# Patient Record
Sex: Female | Born: 1985 | Race: White | Hispanic: No | Marital: Married | Smoking: Former smoker
Health system: Southern US, Community
[De-identification: ages and names within clinical notes are randomized; demographics above are authoritative.]

## PROBLEM LIST (undated history)

## (undated) DIAGNOSIS — F319 Bipolar disorder, unspecified: Secondary | ICD-10-CM

## (undated) DIAGNOSIS — N83201 Unspecified ovarian cyst, right side: Secondary | ICD-10-CM

## (undated) DIAGNOSIS — R51 Headache: Secondary | ICD-10-CM

## (undated) DIAGNOSIS — R519 Headache, unspecified: Secondary | ICD-10-CM

## (undated) DIAGNOSIS — G43909 Migraine, unspecified, not intractable, without status migrainosus: Secondary | ICD-10-CM

## (undated) DIAGNOSIS — J45909 Unspecified asthma, uncomplicated: Secondary | ICD-10-CM

## (undated) DIAGNOSIS — N83202 Unspecified ovarian cyst, left side: Secondary | ICD-10-CM

## (undated) HISTORY — PX: TONSILLECTOMY: SUR1361

## (undated) HISTORY — PX: TUBAL LIGATION: SHX77

## (undated) HISTORY — PX: APPENDECTOMY: SHX54

## (undated) HISTORY — PX: ANKLE ARTHROSCOPY: SUR85

---
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
## 2005-01-03 ENCOUNTER — Observation Stay (HOSPITAL_COMMUNITY): Admission: AD | Admit: 2005-01-03 | Discharge: 2005-01-03 | Payer: Self-pay | Admitting: Gynecology

## 2005-01-04 ENCOUNTER — Inpatient Hospital Stay (HOSPITAL_COMMUNITY): Admission: AD | Admit: 2005-01-04 | Discharge: 2005-01-04 | Payer: Self-pay | Admitting: Gynecology

## 2005-01-08 ENCOUNTER — Inpatient Hospital Stay (HOSPITAL_COMMUNITY): Admission: AD | Admit: 2005-01-08 | Discharge: 2005-01-08 | Payer: Self-pay | Admitting: Gynecology

## 2005-01-14 ENCOUNTER — Inpatient Hospital Stay (HOSPITAL_COMMUNITY): Admission: AD | Admit: 2005-01-14 | Discharge: 2005-01-16 | Payer: Self-pay | Admitting: Gynecology

## 2005-01-30 ENCOUNTER — Ambulatory Visit: Admission: RE | Admit: 2005-01-30 | Discharge: 2005-01-30 | Payer: Self-pay | Admitting: Gynecology

## 2005-11-22 ENCOUNTER — Emergency Department: Payer: Self-pay | Admitting: Emergency Medicine

## 2006-06-18 ENCOUNTER — Emergency Department: Payer: Self-pay | Admitting: Emergency Medicine

## 2006-08-11 ENCOUNTER — Emergency Department: Payer: Self-pay | Admitting: Emergency Medicine

## 2006-09-16 ENCOUNTER — Emergency Department: Payer: Self-pay | Admitting: Emergency Medicine

## 2006-09-17 ENCOUNTER — Emergency Department: Payer: Self-pay | Admitting: Unknown Physician Specialty

## 2006-10-05 ENCOUNTER — Emergency Department (HOSPITAL_COMMUNITY): Admission: EM | Admit: 2006-10-05 | Discharge: 2006-10-05 | Payer: Self-pay | Admitting: Emergency Medicine

## 2006-10-12 ENCOUNTER — Emergency Department: Payer: Self-pay | Admitting: Unknown Physician Specialty

## 2007-01-31 ENCOUNTER — Emergency Department: Payer: Self-pay | Admitting: Emergency Medicine

## 2007-04-22 ENCOUNTER — Ambulatory Visit: Payer: Self-pay | Admitting: Family Medicine

## 2007-05-04 ENCOUNTER — Emergency Department: Payer: Self-pay | Admitting: Internal Medicine

## 2007-05-23 ENCOUNTER — Emergency Department: Payer: Self-pay | Admitting: Internal Medicine

## 2007-06-07 ENCOUNTER — Emergency Department: Payer: Self-pay | Admitting: Emergency Medicine

## 2007-06-11 ENCOUNTER — Emergency Department: Payer: Self-pay | Admitting: Emergency Medicine

## 2007-06-30 ENCOUNTER — Observation Stay: Payer: Self-pay | Admitting: Obstetrics and Gynecology

## 2007-07-06 ENCOUNTER — Ambulatory Visit: Payer: Self-pay | Admitting: General Practice

## 2007-08-17 ENCOUNTER — Observation Stay: Payer: Self-pay | Admitting: Obstetrics and Gynecology

## 2007-08-18 ENCOUNTER — Emergency Department: Payer: Self-pay | Admitting: Emergency Medicine

## 2007-08-19 ENCOUNTER — Observation Stay: Payer: Self-pay | Admitting: Obstetrics and Gynecology

## 2007-09-07 ENCOUNTER — Emergency Department: Payer: Self-pay | Admitting: Emergency Medicine

## 2007-09-30 ENCOUNTER — Observation Stay: Payer: Self-pay | Admitting: Obstetrics and Gynecology

## 2007-10-04 ENCOUNTER — Observation Stay: Payer: Self-pay | Admitting: Certified Nurse Midwife

## 2007-10-16 ENCOUNTER — Observation Stay: Payer: Self-pay

## 2007-10-17 ENCOUNTER — Observation Stay: Payer: Self-pay | Admitting: Obstetrics and Gynecology

## 2007-10-22 ENCOUNTER — Observation Stay: Payer: Self-pay

## 2007-10-24 ENCOUNTER — Observation Stay: Payer: Self-pay | Admitting: Obstetrics and Gynecology

## 2007-10-28 ENCOUNTER — Observation Stay: Payer: Self-pay

## 2007-10-30 ENCOUNTER — Inpatient Hospital Stay: Payer: Self-pay | Admitting: Obstetrics and Gynecology

## 2008-01-24 ENCOUNTER — Emergency Department: Payer: Self-pay | Admitting: Emergency Medicine

## 2008-07-21 ENCOUNTER — Emergency Department: Payer: Self-pay | Admitting: Unknown Physician Specialty

## 2008-09-27 ENCOUNTER — Ambulatory Visit: Payer: Self-pay

## 2008-12-15 ENCOUNTER — Observation Stay: Payer: Self-pay | Admitting: Obstetrics and Gynecology

## 2009-02-14 ENCOUNTER — Observation Stay: Payer: Self-pay | Admitting: Internal Medicine

## 2009-03-03 ENCOUNTER — Observation Stay: Payer: Self-pay

## 2009-03-10 ENCOUNTER — Inpatient Hospital Stay: Payer: Self-pay | Admitting: Obstetrics and Gynecology

## 2009-03-31 ENCOUNTER — Ambulatory Visit: Payer: Self-pay | Admitting: Obstetrics and Gynecology

## 2009-04-02 ENCOUNTER — Emergency Department: Payer: Self-pay | Admitting: Emergency Medicine

## 2009-04-03 ENCOUNTER — Ambulatory Visit: Payer: Self-pay | Admitting: Obstetrics and Gynecology

## 2009-10-28 ENCOUNTER — Emergency Department: Payer: Self-pay | Admitting: Emergency Medicine

## 2010-02-03 ENCOUNTER — Emergency Department: Payer: Self-pay | Admitting: Unknown Physician Specialty

## 2010-02-04 ENCOUNTER — Emergency Department: Payer: Self-pay | Admitting: Emergency Medicine

## 2010-04-28 ENCOUNTER — Emergency Department: Payer: Self-pay | Admitting: Emergency Medicine

## 2010-05-08 ENCOUNTER — Emergency Department: Payer: Self-pay | Admitting: Emergency Medicine

## 2010-06-27 ENCOUNTER — Emergency Department: Payer: Self-pay | Admitting: Unknown Physician Specialty

## 2010-07-11 ENCOUNTER — Emergency Department: Payer: Self-pay | Admitting: Emergency Medicine

## 2010-07-12 ENCOUNTER — Emergency Department (HOSPITAL_COMMUNITY)
Admission: EM | Admit: 2010-07-12 | Discharge: 2010-07-12 | Disposition: A | Discharge status: Home / Self Care | Payer: Self-pay | Attending: Emergency Medicine | Admitting: Emergency Medicine

## 2010-07-12 DIAGNOSIS — R112 Nausea with vomiting, unspecified: Secondary | ICD-10-CM | POA: Insufficient documentation

## 2010-07-12 DIAGNOSIS — R197 Diarrhea, unspecified: Secondary | ICD-10-CM | POA: Insufficient documentation

## 2010-07-12 DIAGNOSIS — F319 Bipolar disorder, unspecified: Secondary | ICD-10-CM | POA: Insufficient documentation

## 2010-07-12 DIAGNOSIS — R109 Unspecified abdominal pain: Secondary | ICD-10-CM | POA: Insufficient documentation

## 2010-07-12 LAB — URINALYSIS, ROUTINE W REFLEX MICROSCOPIC
Bilirubin Urine: NEGATIVE
Glucose, UA: NEGATIVE mg/dL
Nitrite: NEGATIVE
Specific Gravity, Urine: 1.015 (ref 1.005–1.030)
pH: 6.5 (ref 5.0–8.0)

## 2010-07-12 LAB — URINE MICROSCOPIC-ADD ON

## 2010-07-13 LAB — URINE CULTURE
Colony Count: NO GROWTH
Culture  Setup Time: 201204191729

## 2010-07-29 ENCOUNTER — Emergency Department: Payer: Self-pay | Admitting: Internal Medicine

## 2010-08-10 NOTE — Op Note (Signed)
NAMEALVIN, Brock               ACCOUNT NO.:  [id number]   MEDICAL RECORD NO.:  [id number]          PATIENT TYPE:  INP   LOCATION:  9103                          FACILITY:  [facility]   PHYSICIAN:  Charles A. Delcambre, MDDATE OF BIRTH:  [birth year]   DATE OF PROCEDURE:  01/14/2005  DATE OF DISCHARGE:                                 OPERATIVE REPORT   DELIVERY:  Delivery was effected without difficulty.  Spontaneous vaginal  delivery occurred of a vigorous female, Apgars 8 and 9, small first-degree  laceration that was amenable to subcuticular stitch was noted.  Repaired  with local anesthetic and 2-0 Vicryl subcuticular stitch at the 12 o'clock  perineal body.  Placenta delivered spontaneously, three vessel, intact.  Cord gases were sent and are pending at the time of this dictation.  Cord  blood was drawn.  Placenta was handed off to the [facility] [employer] manager.  Estimated blood loss was 300 mL.  Mother and  baby were recovering stably at this time.  Upon delivery, DeLee was carried  out on delivery of the head.  Baby cried vigorously after clamping with  cord, immediately upon delivery.  Bulb suction was carried out after DeLee  and baby continued to cry vigorously.  Infant was vigorous upon delivery and  neonatologsit did not proceed with laryngoscopy secondary to the vigorously  cry.      Charles A. Sydnee Cabal, MD  Electronically Signed     CAD/MEDQ  D:  01/14/2005  T:  01/15/2005  Job:  [id number]

## 2010-08-10 NOTE — Op Note (Signed)
[place]  Patient:    KINLEY, DOZIER                      MRN: [id number] Proc. Date: 11/19/99 Adm. Date:  [id number] Attending:  Susy Frizzle CC:         Loma Sender, M.D.   Operative Report  PREOPERATIVE DIAGNOSIS:  Chronic tonsillitis.  POSTOPERATIVE DIAGNOSIS:  Chronic tonsillitis.  OPERATION:    Tonsillectomy.  SURGEON:  Jefry H. Pollyann Kennedy, M.D.  ANESTHESIA:  General endotracheal anesthesia.  COMPLICATIONS:  None.  ESTIMATED BLOOD LOSS:  Less than 5 cc.  FINDINGS:  Diffuse enlargement of the tonsils with deep cryptic spaces and small areas with debris.  No inflammatory changes today.  REFERRING PHYSICIAN:  Loma Sender, M.D.  HISTORY:  This is a 25 year old with a history of recurring and chronic tonsillitis.  She has had strep throat on may occasions as well over the past few years.  Risks, benefits, alternatives, complications of the procedure were explained to the mother who seemed to understand and agreed to surgery.  DESCRIPTION OF PROCEDURE:  The patient was taken to the operating room and placed on the operating table in supine position.  Following induction of general endotracheal anesthesia, the table was turned 90 degrees.  The patient was positioned and draped in the standard fashion.  Cordes mouth gag was inserted into the oral cavity and used to retract the tongue and mandible and attached to the Mayo stand.  Red rubber catheter was inserted into the right side of the nose, withdrawn through the mouth, and used to retract the soft palate and uvula.  Tonsils were grasped with large-tooth forceps and retracted medially while electrocautery dissection was used to cleanly dissect the tonsil and capsule form surrounding endotracheal muscles.  There was minimal bleeding encountered along the dissection, and electrocautery was used as needed.  The tonsils were sent for pathologic evaluation.  The pharynx was  suction of blood and secretions, irrigated with saline solution and a saline pump tube was used to aspirate the contents of the stomach.  The patient was then awakened, extubated, and transferred to recovery in stable condition.DD:  11/19/99 TD:  11/19/99 Job: [id number]

## 2010-08-10 NOTE — Op Note (Signed)
Angel Brock, Angel Brock               ACCOUNT NO.:  [id number]   MEDICAL RECORD NO.:  [id number]          PATIENT TYPE:  INP   LOCATION:  9103                          [facility]ILITY:  [facility]   PHYSICIAN:  Charles A. Delcambre, MDDATE OF BIRTH:  08-28-1985   DATE OF PROCEDURE:  01/14/2005  DATE OF DISCHARGE:                                 OPERATIVE REPORT   HISTORY OF PRESENT ILLNESS:  A 25 year old para 1-0-0-1, [facility] January 17, 2005 at 39 weeks 4 days estimated gestational age complained of onset of  contractions at 3 p.m.  She was noted to be 5 cm dilated upon arrival with  complete dilation and bulging bags of water.  She was admitted.  She denied  rupture of membranes or bleeding. She had presented from numerous prenatal  rule out labor checks in recent weeks and otherwise pregnancy was  complicated by depression and anxiety.  She has known RH positive, antibody  screen negative, Pap smear normal, rubella immune, VDRL nonreactive,  hepatitis B surface antigen negative.  HIV declined and, therefore, not  noted.  Chlamydia and GC negative.  Triple and quad screen negative.  One-  hour Glucola 89.  Hemoglobin 12.4, hematocrit 31.8 at 26 weeks.  Antibody  screen again negative.  Hemoglobin at 36 weeks 11.7, hematocrit 32.6. RPR  again nonreactive.  GC and Chlamydia cultures at term negative and group B  strep negative at 36 weeks.   PAST MEDICAL HISTORY:  Anxiety or depression.   PAST SURGICAL HISTORY:  Tonsils and adenoids and appendix.   OB HISTORY:  SVD x1.   MEDICATIONS:  Lexapro 10 mg a day.   ALLERGIES:  PENICILLIN reaction not specified.   SOCIAL HISTORY:  Cigarette smoking:  Yes.  No ethanol or drug use.  The  patient is not married.   FAMILY HISTORY:  No major illnesses related.   REVIEW OF SYSTEMS:  As noted in HPI.   PHYSICAL EXAMINATION:  GENERAL:  Alert and oriented x3.  VITAL SIGNS:  Reported blood pressure normal per R.N.  See vitals as noted  in the chart.  HEENT:   Grossly within normal limits.  NECK:  Supple without thyromegaly or adenopathy.  LUNGS:  Clear.  HEART:  Regular rate and rhythm.  A grade 2/6 systolic ejection murmur at  the left sternal border.  ABDOMEN:  Gravid.  Consistent with term.  PELVIC:  8 cm, bulging bag of waters.  Artificial rupture of membranes was  done with consent.  No complications noted other than thick meconium.  Fetal  scalp electrode was placed to better monitor with difficult tracing on the  external monitor.  EXTREMITIES:  Mild edema bilaterally.  FETAL HEART RATE:  110 to 120,  reactive with occasional mild variable  decelerations.  Contractions every two to three minutes, moderately  uncomfortable, moderate to palpation.   ASSESSMENT:  Intrauterine pregnancy at 39 weeks 4 days in active labor,  thick meconium.   PLAN:  Expectant management.  Planned DeLee on the perineum and neonatal  consultation for delivery.  Anticipate delivery soon.  Charles A. Sydnee Cabal, MD  Electronically Signed     CAD/MEDQ  D:  01/14/2005  T:  01/15/2005  Job:  [id number]

## 2010-08-10 NOTE — Op Note (Signed)
[facility]  Patient:    Angel Brock, Angel Brock                      MRN: [id number] Proc. Date: 08/06/00 Adm. Date:  [id number] Disc. Date: [id number] Attending:  Michaele Offer                           Operative Report  PREOPERATIVE DIAGNOSIS:  Right lower quadrant/pelvic pain.  POSTOPERATIVE DIAGNOSIS:  Right lower quadrant/pelvic pain.  PROCEDURE:  Diagnostic laparoscopy.  SURGEON:  Zenaida Niece, M.D.  ASSISTANT:  Anselm Pancoast. Zachery Dakins, M.D.  ANESTHESIA:  General endotracheal tube.  ESTIMATED BLOOD LOSS:  Minimal.  FINDINGS:  A normal pelvis with normal uterus, tubes, ovaries, and no significant adhesions or evidence of endometriosis.  She had a retrocecal appendix which was removed by Dr. Zachery Dakins.  COUNTS:  Correct.  CONDITION:  Stable.  PROCEDURE IN DETAIL:  After appropriate informed consent was obtained, the patient was taken to the operating room and placed in the dorsal supine position.  General anesthesia was induced, and she was placed in mobile stirrups.  Her abdomen was then prepped and draped in the usual sterile fashion for a laparoscopic procedure, and her bladder drained with a red rubber catheter, and a Hulka tenaculum applied to her cervix for uterine manipulation.  Her infraumbilical skin was then infiltrated with 0.25% Marcaine, and a 1.5 cm incision was made.  The Veress needle was inserted into the peritoneal cavity and placement confirmed by the water drop test at an opening pressure of 2 mmHg.  Three liters of CO2 gas were then insufflated. The Veress needle was removed and the 10-11 disposable trocar was then introduced and placement confirmed by the laparoscope.  My limited performance in this laparoscopy was to visualize that the pelvis appeared normal.  There was no source in her pelvis to explain her pain.  No evidence of endometriosis or adhesions.  Thus, Dr. Zachery Dakins took over the procedure and  performed a laparoscopic appendectomy.  His portion is dictated separately. DD:  09/12/00 TD:  09/12/00 Job: [id number]

## 2010-08-10 NOTE — Op Note (Signed)
[facility]  Patient:    Angel Brock, Angel Brock                      MRN: [id number] Proc. Date: 08/06/00 Adm. Date:  [id number] Attending:  Michaele Offer CC:         Zenaida Niece, M.D.   Operative Report  PREOPERATIVE DIAGNOSIS:  Right lower quadrant abdominal pain.  POSTOPERATIVE DIAGNOSIS: Right lower quadrant abdominal pain.  OPERATION:  Laparoscopic evaluation by Dr. Jackelyn Knife and laparoscopic appendectomy, Dr. Zachery Dakins.  She had a retrocecal appendix, and it was not acutely inflamed.  HISTORY:  Angel Brock is a 25 year old Caucasian female, whom I first met approximately two weeks ago when she was seen in the emergency room by the ER physician.  She had been seen the previous night, I think, had the onset or completion of her period and then the following evening, had more pain, and I was asked to see her after she had had a CT that did not show any definite abnormalities.  On examination, she was definitely tender in the right lower quadrant.  Her symptoms had been more right lower quadrant all of the time. She stated her period had been normal and had stopped.  She was not felt to be sexually active, and we did a pelvic ultrasound 24 hours later when she was still having pain.  I recommended that when we treat her with pain medications and laxatives since there was some question that she could be constipated and saw her back in the office the following day.  She has never had an elevated white count.  She has had 3-4 CBCs, all with a white count of approximately 7000.  No left shift.  Urinalysis had been negative and on CT exam, there had been no evidence of obstruction of the right ureter or abnormalities of her kidney.  With these symptoms and the pain occurring after basically her period was completed, I asked Dr. Jackelyn Knife to see her.  He saw her about 10 days ago and could not find any obvious explanation for her pain.  He  reviewed the pelvic ultrasound, did place her on antibiotics, and she was seen back today and said she was no better.  She had been on doxycycline 100 mg b.i.d. for approximately 10 days.  I had discussed with the family that I thought it was very unlikely that this was appendicitis with a normal white count and no fever and little change in her bowel movements, but her mother had had a retrocecal appendix that had ruptured after she had had a negative CT, and this has always been a period of anxiety since the mother says this is the same was she was when she had her appendicitis.  Anyway, Dr. Jackelyn Knife saw her today and because of her persistent pain, recommended a laparoscopic exam be performed, and I said that I could be available.  Dr. Jackelyn Knife had started the procedure when I arrived.  The abdomen was being prepped, and he was catheterizing.  He had put a tenaculum in the cervix, and I scrubbed in as he was making his incisions.  He will dictate his portion of the procedure.  DESCRIPTION OF PROCEDURE:  The laparoscopic exam really showed no abnormalities of the pelvis structures; she did have a small amount of fluid in the pelvis, but we had put some fluid in the Veress needle, and that was probably what we were seeing.  I then  tried to visualize the appendix.  It was noted that she had an area of adhesions.  They may be chronic, but they may not be real chronic, right over the terminal ileum where it was bound down lateral to the lateral pelvic sidewall, and this is where she has complained of her pain.  She has a floppy cecum and in order to visualize the appendix, it was necessary to mobilize this area of the cecum with her turned way down. He had had a 5 mm trocar in the left lower quadrant, and I switched that to a 12 so that we could use the camera from the left lower quadrant.  I had also placed a 5 mm trocar in the right upper quadrant under direct vision and with these two,  we could actually free up this terminal ileum.  The intestine itself looked normal.  Then we could identify where the appendix originated. I then freed up the appendix and really used a vascular GIA to divide the appendix with its junction at the mesentery, and then you could pull up on the appendix and get real exposure so that the remaining portion of the appendix could be dug out of the retrocecal area.  The appendix was not inflamed; the appendix was not enlarged, and there was no evidence of any acute inflammation around it but in order to visualize the whole appendix, I think that the manipulation, etc., and with her history, it was certainly better to have removed it and try to be sure that it was not appendicitis, as we have no other explanation for the pain.  The little bleeders of the main portion of the mesentery vessels were clamped with the GIA and then the little main portion of the other part of the perineum was divided with cautery under direct vision as the appendix was being removed.  There was no evidence of any active bleeding, and we then removed the 12 mm trocar in the left lower quadrant and did suture the fascia with a figure-of-eight of 0 Vicryl, then reinspected that area.  There was no bleeding.  This had been anesthetized with Marcaine.  We looked back at the appendix base area, and it was closed nicely with no evidence of bleeding, irrigated, aspirated again, and removed the aspirating fluid.  We then removed the upper 5 mm trocar.  We then removed the carbon dioxide and withdrew the trocar, had not used a Hasson, and then I placed one figure-of-eight suture in the anterior fascia of 0 Vicryl.  The subcutaneous wounds were closed with 4-0 Vicryl and Benzoin and Steri-Strips on the skin.  The patient will be kept overnight for pain medication, and we had given her 3 grams of Unasyn when we decided we were going to remove the appendix.  The patient hopefully will  be ready for discharge in the a.m., and hopefully her abdominal pain will subside over the next few days. DD:  08/06/00 TD:  08/07/00 Job: [id number]

## 2010-11-08 ENCOUNTER — Emergency Department: Payer: Self-pay | Admitting: *Deleted

## 2010-12-23 ENCOUNTER — Emergency Department: Payer: Self-pay | Admitting: Internal Medicine

## 2010-12-24 ENCOUNTER — Emergency Department: Payer: Self-pay | Admitting: Emergency Medicine

## 2011-01-08 LAB — POCT I-STAT CREATININE
Creatinine, Ser: 0.9
Operator id: 277751

## 2011-01-08 LAB — I-STAT 8, (EC8 V) (CONVERTED LAB)
Bicarbonate: 23.3
HCT: 43
Hemoglobin: 14.6
Operator id: 277751
Sodium: 140
TCO2: 25
pCO2, Ven: 43.2 — ABNORMAL LOW

## 2011-01-08 LAB — URINALYSIS, ROUTINE W REFLEX MICROSCOPIC
Bilirubin Urine: NEGATIVE
Glucose, UA: NEGATIVE
Hgb urine dipstick: NEGATIVE
Ketones, ur: NEGATIVE
Protein, ur: NEGATIVE
Urobilinogen, UA: 0.2

## 2011-01-08 LAB — URINE MICROSCOPIC-ADD ON

## 2011-01-08 LAB — URINE CULTURE: Colony Count: 45000

## 2011-01-08 LAB — WET PREP, GENITAL
Trich, Wet Prep: NONE SEEN
Yeast Wet Prep HPF POC: NONE SEEN

## 2011-01-08 LAB — POCT PREGNANCY, URINE: Preg Test, Ur: NEGATIVE

## 2011-01-14 ENCOUNTER — Emergency Department: Payer: Self-pay | Admitting: *Deleted

## 2011-02-08 ENCOUNTER — Emergency Department: Payer: Self-pay | Admitting: *Deleted

## 2012-02-28 ENCOUNTER — Encounter (HOSPITAL_COMMUNITY): Payer: Self-pay | Admitting: *Deleted

## 2012-02-28 ENCOUNTER — Emergency Department (HOSPITAL_COMMUNITY)
Admission: EM | Admit: 2012-02-28 | Discharge: 2012-02-28 | Disposition: A | Discharge status: Home / Self Care | Payer: Self-pay | Attending: Emergency Medicine | Admitting: Emergency Medicine

## 2012-02-28 DIAGNOSIS — R51 Headache: Secondary | ICD-10-CM | POA: Insufficient documentation

## 2012-02-28 DIAGNOSIS — Z7722 Contact with and (suspected) exposure to environmental tobacco smoke (acute) (chronic): Secondary | ICD-10-CM

## 2012-02-28 DIAGNOSIS — F17200 Nicotine dependence, unspecified, uncomplicated: Secondary | ICD-10-CM | POA: Insufficient documentation

## 2012-02-28 DIAGNOSIS — Y990 Civilian activity done for income or pay: Secondary | ICD-10-CM | POA: Insufficient documentation

## 2012-02-28 DIAGNOSIS — Y9389 Activity, other specified: Secondary | ICD-10-CM | POA: Insufficient documentation

## 2012-02-28 DIAGNOSIS — R110 Nausea: Secondary | ICD-10-CM | POA: Insufficient documentation

## 2012-02-28 DIAGNOSIS — R071 Chest pain on breathing: Secondary | ICD-10-CM | POA: Insufficient documentation

## 2012-02-28 DIAGNOSIS — Y9269 Other specified industrial and construction area as the place of occurrence of the external cause: Secondary | ICD-10-CM | POA: Insufficient documentation

## 2012-02-28 DIAGNOSIS — X001XXA Exposure to smoke in uncontrolled fire in building or structure, initial encounter: Secondary | ICD-10-CM | POA: Insufficient documentation

## 2012-02-28 DIAGNOSIS — J45909 Unspecified asthma, uncomplicated: Secondary | ICD-10-CM | POA: Insufficient documentation

## 2012-02-28 HISTORY — DX: Unspecified asthma, uncomplicated: J45.909

## 2012-02-28 LAB — CARBOXYHEMOGLOBIN: O2 Saturation: 98.2 %

## 2012-02-28 MED ORDER — ALBUTEROL SULFATE HFA 108 (90 BASE) MCG/ACT IN AERS
2.0000 | INHALATION_SPRAY | Freq: Once | RESPIRATORY_TRACT | Status: AC
  Administered 2012-02-28: 2 via RESPIRATORY_TRACT
  Filled 2012-02-28: qty 6.7

## 2012-02-28 MED ORDER — IBUPROFEN 400 MG PO TABS
800.0000 mg | ORAL_TABLET | Freq: Once | ORAL | Status: AC
  Administered 2012-02-28: 800 mg via ORAL
  Filled 2012-02-28: qty 2

## 2012-02-28 NOTE — ED Notes (Signed)
Pt ambulatory to room with no signs of respiratory distress. Non labored resting in bed. C/o chest tightness.
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
## 2012-04-08 ENCOUNTER — Emergency Department: Payer: Self-pay | Admitting: Unknown Physician Specialty

## 2012-07-09 ENCOUNTER — Emergency Department: Payer: Self-pay | Admitting: Emergency Medicine

## 2012-07-09 LAB — BASIC METABOLIC PANEL
Anion Gap: 5 — ABNORMAL LOW (ref 7–16)
Calcium, Total: 9.1 mg/dL (ref 8.5–10.1)
Chloride: 109 mmol/L — ABNORMAL HIGH (ref 98–107)
Co2: 24 mmol/L (ref 21–32)
EGFR (Non-African Amer.): >60
Glucose: 86 mg/dL (ref 65–99)
Potassium: 4 mmol/L (ref 3.5–5.1)
Sodium: 138 mmol/L (ref 136–145)

## 2012-07-09 LAB — CBC
HGB: 15.3 g/dL (ref 12.0–16.0)
MCHC: 35.2 g/dL (ref 32.0–36.0)
Platelet: 222 10*3/uL (ref 150–440)
RBC: 4.81 10*6/uL (ref 3.80–5.20)
RDW: 12.5 % (ref 11.5–14.5)
WBC: 6.7 10*3/uL (ref 3.6–11.0)

## 2012-07-09 LAB — TROPONIN I: Troponin-I: <0.02 ng/mL

## 2012-07-15 ENCOUNTER — Emergency Department: Payer: Self-pay | Admitting: Emergency Medicine

## 2012-09-26 ENCOUNTER — Emergency Department: Payer: Self-pay | Admitting: Emergency Medicine

## 2012-12-30 ENCOUNTER — Ambulatory Visit: Payer: Self-pay | Admitting: Podiatry

## 2013-02-25 ENCOUNTER — Emergency Department: Payer: Self-pay | Admitting: Emergency Medicine

## 2013-09-07 ENCOUNTER — Emergency Department: Payer: Self-pay | Admitting: Internal Medicine

## 2013-09-09 ENCOUNTER — Emergency Department: Payer: Self-pay | Admitting: Emergency Medicine

## 2013-09-21 ENCOUNTER — Emergency Department: Payer: Self-pay | Admitting: Emergency Medicine

## 2013-09-21 LAB — CBC WITH DIFFERENTIAL/PLATELET
Basophil #: 0 10*3/uL (ref 0.0–0.1)
Basophil %: 0.5 %
Eosinophil #: 0.3 10*3/uL (ref 0.0–0.7)
Eosinophil %: 3 %
HCT: 42.8 % (ref 35.0–47.0)
HGB: 15.1 g/dL (ref 12.0–16.0)
Lymphocyte #: 3.3 10*3/uL (ref 1.0–3.6)
Lymphocyte %: 34.8 %
MCH: 33 pg (ref 26.0–34.0)
MCHC: 35.3 g/dL (ref 32.0–36.0)
MCV: 93 fL (ref 80–100)
MONOS PCT: 8 %
Monocyte #: 0.8 x10 3/mm (ref 0.2–0.9)
Neutrophil #: 5.1 10*3/uL (ref 1.4–6.5)
Neutrophil %: 53.7 %
PLATELETS: 229 10*3/uL (ref 150–440)
RBC: 4.58 10*6/uL (ref 3.80–5.20)
RDW: 12.3 % (ref 11.5–14.5)
WBC: 9.4 10*3/uL (ref 3.6–11.0)

## 2013-09-21 LAB — URINALYSIS, COMPLETE
Bilirubin,UR: NEGATIVE
Blood: NEGATIVE
GLUCOSE, UR: NEGATIVE mg/dL (ref 0–75)
Ketone: NEGATIVE
Nitrite: NEGATIVE
PROTEIN: NEGATIVE
Ph: 5 (ref 4.5–8.0)
RBC,UR: 4 /HPF (ref 0–5)
Specific Gravity: 1.025 (ref 1.003–1.030)
Squamous Epithelial: 3
WBC UR: 29 /HPF (ref 0–5)

## 2013-09-21 LAB — LIPASE, BLOOD: Lipase: 129 U/L (ref 73–393)

## 2013-09-21 LAB — COMPREHENSIVE METABOLIC PANEL
ALK PHOS: 71 U/L
Albumin: 4 g/dL (ref 3.4–5.0)
Anion Gap: 5 — ABNORMAL LOW (ref 7–16)
BILIRUBIN TOTAL: 0.3 mg/dL (ref 0.2–1.0)
BUN: 12 mg/dL (ref 7–18)
CALCIUM: 9.3 mg/dL (ref 8.5–10.1)
Chloride: 108 mmol/L — ABNORMAL HIGH (ref 98–107)
Co2: 25 mmol/L (ref 21–32)
Creatinine: 0.88 mg/dL (ref 0.60–1.30)
EGFR (African American): >60
GLUCOSE: 99 mg/dL (ref 65–99)
Osmolality: 275 (ref 275–301)
POTASSIUM: 3.5 mmol/L (ref 3.5–5.1)
SGOT(AST): 12 U/L — ABNORMAL LOW (ref 15–37)
SGPT (ALT): 15 U/L (ref 12–78)
SODIUM: 138 mmol/L (ref 136–145)
Total Protein: 7.2 g/dL (ref 6.4–8.2)

## 2013-09-21 LAB — HCG, QUANTITATIVE, PREGNANCY: Beta Hcg, Quant.: <1 m[IU]/mL — ABNORMAL LOW

## 2014-01-04 ENCOUNTER — Inpatient Hospital Stay (HOSPITAL_COMMUNITY)
Admission: AD | Admit: 2014-01-04 | Discharge: 2014-01-05 | Disposition: A | Discharge status: Home / Self Care | Payer: Self-pay | Source: Ambulatory Visit | Attending: Obstetrics & Gynecology | Admitting: Obstetrics & Gynecology

## 2014-01-04 ENCOUNTER — Encounter (HOSPITAL_COMMUNITY): Payer: Self-pay

## 2014-01-04 DIAGNOSIS — F17210 Nicotine dependence, cigarettes, uncomplicated: Secondary | ICD-10-CM | POA: Insufficient documentation

## 2014-01-04 DIAGNOSIS — N946 Dysmenorrhea, unspecified: Secondary | ICD-10-CM | POA: Insufficient documentation

## 2014-01-04 HISTORY — DX: Headache, unspecified: R51.9

## 2014-01-04 HISTORY — DX: Headache: R51

## 2014-01-04 LAB — POCT PREGNANCY, URINE: PREG TEST UR: NEGATIVE

## 2014-01-04 MED ORDER — IBUPROFEN 800 MG PO TABS
800.0000 mg | ORAL_TABLET | Freq: Once | ORAL | Status: AC
  Administered 2014-01-05: 800 mg via ORAL
  Filled 2014-01-04: qty 1

## 2014-01-04 NOTE — MAU Note (Signed)
Pt states she's having pregnancy like symptoms and thought she may be pregnant. Has n/v, cramping, spotting, breast tenderness and back pain. Took ibuprofen at 2030-did not help.

## 2014-01-05 DIAGNOSIS — N946 Dysmenorrhea, unspecified: Secondary | ICD-10-CM

## 2014-01-05 LAB — URINALYSIS, ROUTINE W REFLEX MICROSCOPIC
Bilirubin Urine: NEGATIVE
GLUCOSE, UA: NEGATIVE mg/dL
KETONES UR: NEGATIVE mg/dL
Leukocytes, UA: NEGATIVE
Nitrite: NEGATIVE
PH: 6.5 (ref 5.0–8.0)
PROTEIN: NEGATIVE mg/dL
SPECIFIC GRAVITY, URINE: 1.015 (ref 1.005–1.030)
UROBILINOGEN UA: 0.2 mg/dL (ref 0.0–1.0)

## 2014-01-05 LAB — URINE MICROSCOPIC-ADD ON

## 2014-01-05 NOTE — Discharge Instructions (Signed)

## 2014-01-05 NOTE — MAU Provider Note (Signed)
  History     CSN: [id number]  Arrival date and time: 01/04/14 2332   First Provider Initiated Contact with Patient 01/04/14 2353      Chief Complaint  Patient presents with  . Back Pain  . Abdominal Cramping  . Vaginal Bleeding   Back Pain  Abdominal Cramping  Vaginal Bleeding Associated symptoms include back pain.    Jacinta Shoeiffany A Cordts is a 28 y.o. [phone] who presents today with cramping. She states that her period was two days late, and then today when it started she had "terrible cramps". She states that she has never had cramps like this before. She states that she took 400mg  of ibuprofen around 2000, but has not taken anything else. She states that it has not helped with the pain.   Past Medical History  Diagnosis Date  . Asthma   . Headache     Past Surgical History  Procedure Laterality Date  . Tonsillectomy    . Appendectomy      No family history on file.  History  Substance Use Topics  . Smoking status: Current Every Day Smoker -- 0.50 packs/day    Types: Cigarettes  . Smokeless tobacco: Not on file  . Alcohol Use: Yes     Comment: occasional    Allergies:  Allergies  Allergen Reactions  . Geodon [Ziprasidone Hcl] Anaphylaxis  . Penicillins Swelling  . Rocephin [Ceftriaxone] Rash    Prescriptions prior to admission  Medication Sig Dispense Refill  . ibuprofen (ADVIL,MOTRIN) 200 MG tablet Take 200 mg by mouth every 6 (six) hours as needed.      Marland Kitchen. acetaminophen (TYLENOL) 325 MG tablet Take 650 mg by mouth every 6 (six) hours as needed. For pain        Review of Systems  Genitourinary: Positive for vaginal bleeding.  Musculoskeletal: Positive for back pain.   Physical Exam   Blood pressure 122/95, pulse 85, resp. rate 18, last menstrual period 01/04/2014.  Physical Exam  Nursing note and vitals reviewed. Constitutional: She is oriented to person, place, and time. She appears well-developed and well-nourished. No distress.  Cardiovascular:  Normal rate.   Respiratory: Effort normal.  GI: Soft. There is no tenderness. There is no rebound.  Neurological: She is alert and oriented to person, place, and time.  Skin: Skin is warm and dry.  Psychiatric: She has a normal mood and affect.    MAU Course  Procedures  0015: Offered the patient toradol for the pain. She states "I really do not want a shot. Is toradol like ultram? Can't I have something stronger?" Patient was given ibuprofen.  [id number]: Patient reports that she is feeling better at this time, and would like to go home.   Assessment and Plan   1. Dysmenorrhea    Comfort measures reviewed Return to MAU as needed Establish care with OBGYN   Tawnya CrookHogan, Lakira Ogando Donovan 01/05/2014, 12:35 AM

## 2014-01-24 ENCOUNTER — Encounter (HOSPITAL_COMMUNITY): Payer: Self-pay

## 2014-04-21 ENCOUNTER — Emergency Department: Payer: Self-pay | Admitting: Emergency Medicine

## 2014-04-21 LAB — BASIC METABOLIC PANEL
ANION GAP: 6 — AB (ref 7–16)
BUN: 14 mg/dL (ref 7–18)
Calcium, Total: 9 mg/dL (ref 8.5–10.1)
Chloride: 108 mmol/L — ABNORMAL HIGH (ref 98–107)
Co2: 25 mmol/L (ref 21–32)
Creatinine: 0.83 mg/dL (ref 0.60–1.30)
EGFR (African American): >60
GLUCOSE: 82 mg/dL (ref 65–99)
Osmolality: 277 (ref 275–301)
POTASSIUM: 3.5 mmol/L (ref 3.5–5.1)
SODIUM: 139 mmol/L (ref 136–145)

## 2014-04-21 LAB — CBC
HCT: 42.6 % (ref 35.0–47.0)
HGB: 14.4 g/dL (ref 12.0–16.0)
MCH: 31.7 pg (ref 26.0–34.0)
MCHC: 33.8 g/dL (ref 32.0–36.0)
MCV: 94 fL (ref 80–100)
Platelet: 209 10*3/uL (ref 150–440)
RBC: 4.54 10*6/uL (ref 3.80–5.20)
RDW: 12.5 % (ref 11.5–14.5)
WBC: 6.3 10*3/uL (ref 3.6–11.0)

## 2014-04-21 LAB — TROPONIN I: Troponin-I: <0.02 ng/mL

## 2014-05-12 ENCOUNTER — Emergency Department: Payer: Self-pay | Admitting: Emergency Medicine

## 2014-05-13 ENCOUNTER — Emergency Department: Payer: Self-pay | Admitting: Emergency Medicine

## 2014-08-19 ENCOUNTER — Other Ambulatory Visit: Payer: Self-pay

## 2014-08-19 ENCOUNTER — Emergency Department: Payer: Self-pay

## 2014-08-19 DIAGNOSIS — Z880 Allergy status to penicillin: Secondary | ICD-10-CM | POA: Insufficient documentation

## 2014-08-19 DIAGNOSIS — R071 Chest pain on breathing: Secondary | ICD-10-CM | POA: Insufficient documentation

## 2014-08-19 DIAGNOSIS — Z720 Tobacco use: Secondary | ICD-10-CM | POA: Insufficient documentation

## 2014-08-19 LAB — CBC
HCT: 42.6 % (ref 35.0–47.0)
Hemoglobin: 15 g/dL (ref 12.0–16.0)
MCH: 32.5 pg (ref 26.0–34.0)
MCHC: 35.1 g/dL (ref 32.0–36.0)
MCV: 92.5 fL (ref 80.0–100.0)
Platelets: 218 10*3/uL (ref 150–440)
RBC: 4.6 MIL/uL (ref 3.80–5.20)
RDW: 12.2 % (ref 11.5–14.5)
WBC: 7.4 10*3/uL (ref 3.6–11.0)

## 2014-08-19 LAB — BASIC METABOLIC PANEL
Anion gap: 8 (ref 5–15)
BUN: 12 mg/dL (ref 6–20)
CO2: 22 mmol/L (ref 22–32)
Calcium: 9.4 mg/dL (ref 8.9–10.3)
Chloride: 107 mmol/L (ref 101–111)
Creatinine, Ser: 1.06 mg/dL — ABNORMAL HIGH (ref 0.44–1.00)
GFR calc Af Amer: >60 mL/min (ref >60)
Glucose, Bld: 105 mg/dL — ABNORMAL HIGH (ref 65–99)
Potassium: 3.4 mmol/L — ABNORMAL LOW (ref 3.5–5.1)
Sodium: 137 mmol/L (ref 135–145)

## 2014-08-19 LAB — TROPONIN I: Troponin I: <0.03 ng/mL (ref <0.031)

## 2014-08-19 NOTE — ED Notes (Signed)
Pt presents to ER alert and in NAD. Pt states she is having sharp CP that started last night, reports nausea.

## 2014-08-20 ENCOUNTER — Emergency Department
Admission: EM | Admit: 2014-08-20 | Discharge: 2014-08-20 | Disposition: A | Discharge status: Home / Self Care | Payer: Self-pay | Attending: Emergency Medicine | Admitting: Emergency Medicine

## 2014-08-20 DIAGNOSIS — R071 Chest pain on breathing: Secondary | ICD-10-CM

## 2014-08-20 LAB — TROPONIN I: Troponin I: <0.03 ng/mL (ref <0.031)

## 2014-08-20 LAB — FIBRIN DERIVATIVES D-DIMER (ARMC ONLY): Fibrin derivatives D-dimer (ARMC): 164 (ref 0–499)

## 2014-08-20 MED ORDER — ASPIRIN 81 MG PO CHEW
CHEWABLE_TABLET | ORAL | Status: AC
  Administered 2014-08-20: 324 mg via ORAL
  Filled 2014-08-20: qty 4

## 2014-08-20 MED ORDER — ASPIRIN 81 MG PO CHEW
324.0000 mg | CHEWABLE_TABLET | Freq: Once | ORAL | Status: AC
  Administered 2014-08-20: 324 mg via ORAL

## 2014-08-20 NOTE — Discharge Instructions (Signed)
Chest Pain Observation  Please call Monday to set up follow-up with [facility] cardiology.  Is very important that she get close follow-up. Should you develop severe chest pain, vomiting, sweatiness, difficulty breathing, or your symptoms are noted to be worsening please return to the ER right away.  It is often hard to give a specific diagnosis for the cause of chest pain. Among other possibilities your symptoms might be caused by inadequate oxygen delivery to your heart (angina). Angina that is not treated or evaluated can lead to a heart attack (myocardial infarction) or death. Blood tests, electrocardiograms, and X-rays may have been done to help determine a possible cause of your chest pain. After evaluation and observation, your health care provider has determined that it is unlikely your pain was caused by an unstable condition that requires hospitalization. However, a full evaluation of your pain may need to be completed, with additional diagnostic testing as directed. It is very important to keep your follow-up appointments. Not keeping your follow-up appointments could result in permanent heart damage, disability, or death. If there is any problem keeping your follow-up appointments, you must call your health care provider. HOME CARE INSTRUCTIONS  Due to the slight chance that your pain could be angina, it is important to follow your health care provider's treatment plan and also maintain a healthy lifestyle:  Maintain or work toward achieving a healthy weight.  Stay physically active and exercise regularly.  Decrease your salt intake.  Eat a balanced, healthy diet. Talk to a dietitian to learn about heart-healthy foods.  Increase your fiber intake by including whole grains, vegetables, fruits, and nuts in your diet.  Avoid situations that cause stress, anger, or depression.  Take medicines as advised by your health care provider. Report any side effects to your health care provider.  Do not stop medicines or adjust the dosages on your own.  Quit smoking. Do not use nicotine patches or gum until you check with your health care provider.  Keep your blood pressure, blood sugar, and cholesterol levels within normal limits.  Limit alcohol intake to no more than 1 drink per day for women who are not pregnant and 2 drinks per day for men.  Do not abuse drugs. SEEK IMMEDIATE MEDICAL CARE IF: You have severe chest pain or pressure which may include symptoms such as:  You feel pain or pressure in your arms, neck, jaw, or back.  You have severe back or abdominal pain, feel sick to your stomach (nauseous), or throw up (vomit).  You are sweating profusely.  You are having a fast or irregular heartbeat.  You feel short of breath while at rest.  You notice increasing shortness of breath during rest, sleep, or with activity.  You have chest pain that does not get better after rest or after taking your usual medicine.  You wake from sleep with chest pain.  You are unable to sleep because you cannot breathe.  You develop a frequent cough or you are coughing up blood.  You feel dizzy, faint, or experience extreme fatigue.  You develop severe weakness, dizziness, fainting, or chills. Any of these symptoms may represent a serious problem that is an emergency. Do not wait to see if the symptoms will go away. Call your local emergency services (911 in the [place] Do not drive yourself to the hospital. MAKE SURE YOU:  Understand these instructions.  Will watch your condition.  Will get help right away if you are not doing well or  get worse. Document Released: 04/13/2010 Document Revised: 03/16/2013 Document Reviewed: 09/10/2012 [facility] Patient Information 2015 [place], [place]. This information is not intended to replace advice given to you by your health care provider. Make sure you discuss any questions you have with your health care provider.

## 2014-08-20 NOTE — ED Provider Notes (Signed)
[facility] Emergency Department Provider Note  ____________________________________________  Time seen: Approximately 2:47 AM  I have reviewed the triage vital signs and the nursing notes.   HISTORY  Chief Complaint Chest Pain    HPI Angel Brock is a 29 y.o. female who presents with chest pain. Patient states she feels a "stomping" sensation in the middle of her chest for approximately last 24 hours. This is nonradiating. It lasts briefly only a few seconds at a time, but when it is severe it does kind of "take breath away". She reports that she has been told to see a cardiologist previously after her ER visits for chest pain but has not followed up due to insurance concerns, however she is now enrolling in insurance.  Patient denies recent surgery, no leg swelling, no history of DVT. She does not take any yesterday and. She is a smoker. She denies having a history of high blood pressure or high cholesterol. She does have a family history of coronary disease, although it is unclear if this was from heart attacks in younger people.   Past Medical History  Diagnosis Date  . Asthma   . Headache     There are no active problems to display for this patient.   Past Surgical History  Procedure Laterality Date  . Tonsillectomy    . Appendectomy      Current Outpatient Rx  Name  Route  Sig  Dispense  Refill  . acetaminophen (TYLENOL) 325 MG tablet   Oral   Take 650 mg by mouth every 6 (six) hours as needed. For pain         . ibuprofen (ADVIL,MOTRIN) 200 MG tablet   Oral   Take 200 mg by mouth every 6 (six) hours as needed.           Allergies Geodon; Norco; Penicillins; Zofran; and Rocephin  No family history on file.  Social History History  Substance Use Topics  . Smoking status: Current Every Day Smoker -- 0.50 packs/day    Types: Cigarettes  . Smokeless tobacco: Not on file  . Alcohol Use: Yes     Comment: occasional    Review  of Systems Constitutional: No fever/chills Eyes: No visual changes. ENT: No sore throat. Cardiovascular: See history of present illness  Respiratory: Denies shortness of breath at present, occasional feeling of restlessness when pain is present. Gastrointestinal: No abdominal pain.  No nausea, no vomiting.  No diarrhea.  No constipation. Genitourinary: Negative for dysuria. Musculoskeletal: Negative for back pain. Skin: Negative for rash. Neurological: Negative for headaches, focal weakness or numbness. Denies pregnancy had a tubal ligation   10-point ROS otherwise negative.  ____________________________________________   PHYSICAL EXAM:  VITAL SIGNS: ED Triage Vitals  Enc Vitals Group     BP 08/19/14 1940 126/77 mmHg     Pulse Rate 08/19/14 1940 103     Resp 08/19/14 1940 24     Temp 08/19/14 1940 98.4 F (36.9 C)     Temp Source 08/19/14 1940 Oral     SpO2 08/19/14 1940 96 %     Weight 08/19/14 1936 190 lb (86.183 kg)     Height 08/19/14 1936  (1.549 m)     Head Cir --      Peak Flow --      Pain Score 08/19/14 1936 10     Pain Loc --      Pain Edu? --      Excl. in  GC? --     Constitutional: Alert and oriented. Well appearing and in no acute distress. Eyes: Conjunctivae are normal. PERRL. EOMI. Head: Atraumatic. Nose: No congestion/rhinnorhea. Mouth/Throat: Mucous membranes are moist.  Oropharynx non-erythematous. Neck: No stridor.   Cardiovascular: Normal rate, regular rhythm. Grossly normal heart sounds.  Good peripheral circulation. Respiratory: Normal respiratory effort.  No retractions. Lungs CTAB. Gastrointestinal: Soft and nontender. No distention. No abdominal bruits. No CVA tenderness. Musculoskeletal: No lower extremity tenderness nor edema.  No joint effusions. Neurologic:  Normal speech and language. No gross focal neurologic deficits are appreciated. Speech is normal. No gait instability. Skin:  Skin is warm, dry and intact. No rash  noted. Psychiatric: Mood and affect are normal. Speech and behavior are normal.  ____________________________________________   LABS (all labs ordered are listed, but only abnormal results are displayed)  Labs Reviewed  BASIC METABOLIC PANEL - Abnormal; Notable for the following:    Potassium 3.4 (*)    Glucose, Bld 105 (*)    Creatinine, Ser 1.06 (*)    All other components within normal limits  CBC  TROPONIN I  TROPONIN I  FIBRIN DERIVATIVES D-DIMER ([facility] ONLY)   ____________________________________________  EKG  ED ECG REPORT I, Ioan Landini, the attending physician, personally viewed and interpreted this ECG.   Date: 08/20/2014  EKG Time: 1942  Rate: 110  Rhythm: normal EKG, except for sinus tachycardia  Axis: 78  Intervals: Incomplete right bundle branch  ST&T Change: No acute ischemic changes  ____________________________________________  RADIOLOGY  Clear lungs ____________________________________________   PROCEDURES  Procedure(s) performed: None  Critical Care performed: No  ____________________________________________   INITIAL IMPRESSION / ASSESSMENT AND PLAN / ED COURSE  Pertinent labs & imaging results that were available during my care of the patient were reviewed by me and considered in my medical decision making (see chart for details).  Patient heart score is low risk. She describes atypical chest pain. She is a smoker, and initially was tachycardic but her rate is now normal in the ER. Based on her description I highly doubt acute coronary syndrome, we will send a d-dimer to exclude PE as she is low risk by well scoring. Her symptoms are very intermittent and she is presently pain-free. There is no acute ischemic changes on EKG.  I discussed with the patient the importance of follow-up with cardiology of discharge, she is quite agreeable to this plan. Her insurance situation has changed and she will now have health insurance soon.  Close  return precautions and follow-up recommendations discussed with the patient who is agreeable with plan of care.  ----------------------------------------- 3:10 AM on 08/20/2014 -----------------------------------------  D-dimer and second troponin within normal limits. Vital signs normal. At this point I discussed with the patient return precautions and follow-up plan of care. She is quite agreeable. She is deemed low risk for clinically significant coronary disease. ____________________________________________   FINAL CLINICAL IMPRESSION(S) / ED DIAGNOSES  Final diagnoses:  Chest pain on respiration      Sharyn CreamerMark Georgie Eduardo, MD 08/20/14 0310

## 2014-08-20 NOTE — ED Notes (Signed)
Provided pt with sandwich tray (ok per MD).

## 2014-09-01 ENCOUNTER — Emergency Department: Payer: Self-pay

## 2014-09-01 ENCOUNTER — Emergency Department
Admission: EM | Admit: 2014-09-01 | Discharge: 2014-09-01 | Disposition: A | Discharge status: Home / Self Care | Payer: Self-pay | Attending: Emergency Medicine | Admitting: Emergency Medicine

## 2014-09-01 ENCOUNTER — Encounter: Payer: Self-pay | Admitting: Emergency Medicine

## 2014-09-01 DIAGNOSIS — Y9389 Activity, other specified: Secondary | ICD-10-CM | POA: Insufficient documentation

## 2014-09-01 DIAGNOSIS — Z720 Tobacco use: Secondary | ICD-10-CM | POA: Insufficient documentation

## 2014-09-01 DIAGNOSIS — Y9289 Other specified places as the place of occurrence of the external cause: Secondary | ICD-10-CM | POA: Insufficient documentation

## 2014-09-01 DIAGNOSIS — S93401A Sprain of unspecified ligament of right ankle, initial encounter: Secondary | ICD-10-CM | POA: Insufficient documentation

## 2014-09-01 DIAGNOSIS — Y998 Other external cause status: Secondary | ICD-10-CM | POA: Insufficient documentation

## 2014-09-01 DIAGNOSIS — Z880 Allergy status to penicillin: Secondary | ICD-10-CM | POA: Insufficient documentation

## 2014-09-01 DIAGNOSIS — W1839XA Other fall on same level, initial encounter: Secondary | ICD-10-CM | POA: Insufficient documentation

## 2014-09-01 MED ORDER — OXYCODONE-ACETAMINOPHEN 5-325 MG PO TABS: 1.0000 | ORAL_TABLET | Freq: Four times a day (QID) | ORAL | Status: DC | PRN

## 2014-09-01 NOTE — Discharge Instructions (Signed)

## 2014-09-01 NOTE — ED Provider Notes (Signed)
[facility] Emergency Department Provider Note  ____________________________________________  Time seen: Approximately 12:52 PM  I have reviewed the triage vital signs and the nursing notes.   HISTORY  Chief Complaint Ankle Pain   HPI Angel Brock is a 29 y.o. female who presents to the emergency department forright ankle pain after twisting it yesterday. She states that this morning she was unable to bear any weight on her right foot due to the pain. She has no history of injury to the right ankle. She has not taken anything at home for pain.   Past Medical History  Diagnosis Date  . Asthma   . Headache     There are no active problems to display for this patient.   Past Surgical History  Procedure Laterality Date  . Tonsillectomy    . Appendectomy    . Tubal ligation      Current Outpatient Rx  Name  Route  Sig  Dispense  Refill  . acetaminophen (TYLENOL) 325 MG tablet   Oral   Take 650 mg by mouth every 6 (six) hours as needed. For pain         . ibuprofen (ADVIL,MOTRIN) 200 MG tablet   Oral   Take 200 mg by mouth every 6 (six) hours as needed.         Marland Kitchen oxyCODONE-acetaminophen (ROXICET) 5-325 MG per tablet   Oral   Take 1 tablet by mouth every 6 (six) hours as needed.   6 tablet   0     Allergies Geodon; Norco; Penicillins; Zofran; and Rocephin  No family history on file.  Social History History  Substance Use Topics  . Smoking status: Current Every Day Smoker -- 0.50 packs/day    Types: Cigarettes  . Smokeless tobacco: Not on file  . Alcohol Use: Yes     Comment: occasional    Review of Systems Constitutional: No recent illness. Eyes: No visual changes. ENT: No sore throat. Cardiovascular: Denies chest pain or palpitations. Respiratory: Denies shortness of breath. Gastrointestinal: No abdominal pain.  Genitourinary: Negative for dysuria. Musculoskeletal: Pain in right ankle Skin: Negative for  rash. Neurological: Negative for headaches, focal weakness or numbness. 10-point ROS otherwise negative.  ____________________________________________   PHYSICAL EXAM:  VITAL SIGNS: ED Triage Vitals  Enc Vitals Group     BP 09/01/14 1222 113/79 mmHg     Pulse Rate 09/01/14 1227 70     Resp 09/01/14 1222 18     Temp 09/01/14 1222 97.9 F (36.6 C)     Temp Source 09/01/14 1222 Oral     SpO2 09/01/14 1222 98 %     Weight 09/01/14 1222 190 lb (86.183 kg)     Height 09/01/14 1222  (1.549 m)     Head Cir --      Peak Flow --      Pain Score 09/01/14 1224 7     Pain Loc --      Pain Edu? --      Excl. in GC? --     Constitutional: Alert and oriented. Well appearing and in no acute distress. Eyes: Conjunctivae are normal. EOMI. Head: Atraumatic. Nose: No congestion/rhinnorhea. Neck: No stridor.  Respiratory: Normal respiratory effort.   Musculoskeletal: Mildly swollen right ankle with limited range of motion due to pain. No obvious deformity. Neurologic:  Normal speech and language. No gross focal neurologic deficits are appreciated. Speech is normal. No gait instability. Skin:  Skin is warm, dry and  intact. Atraumatic. Psychiatric: Mood and affect are normal. Speech and behavior are normal.  ____________________________________________   LABS (all labs ordered are listed, but only abnormal results are displayed)  Labs Reviewed - No data to display ____________________________________________  RADIOLOGY  Negative for acute bony injury. ____________________________________________   PROCEDURES  Procedure(s) performed: Stirrup splint applied to the right ankle by ER tech. Crutches also given. Neurovascularly intact post-splint application.   ____________________________________________   INITIAL IMPRESSION / ASSESSMENT AND PLAN / ED COURSE  Pertinent labs & imaging results that were available during my care of the patient were reviewed by me and considered  in my medical decision making (see chart for details).  Patient was advised follow-up with orthopedics for symptoms that are not improving over the week. She was advised to return to emergency department for symptoms that change or worsen if she is unable schedule an appointment. ____________________________________________   FINAL CLINICAL IMPRESSION(S) / ED DIAGNOSES  Final diagnoses:  Ankle sprain, right, initial encounter       Chinita Pester, FNP 09/01/14 1735  [name], MD 09/02/14 1258

## 2014-09-01 NOTE — ED Notes (Signed)
Pt states she rolled her ankle yesterday, fell and heard a pop, today, unable to put pressure on it.

## 2014-09-08 ENCOUNTER — Encounter: Payer: Self-pay | Admitting: Emergency Medicine

## 2014-09-08 ENCOUNTER — Emergency Department: Payer: Self-pay

## 2014-09-08 ENCOUNTER — Emergency Department
Admission: EM | Admit: 2014-09-08 | Discharge: 2014-09-08 | Disposition: A | Discharge status: Home / Self Care | Payer: Self-pay | Attending: Emergency Medicine | Admitting: Emergency Medicine

## 2014-09-08 DIAGNOSIS — R197 Diarrhea, unspecified: Secondary | ICD-10-CM | POA: Insufficient documentation

## 2014-09-08 DIAGNOSIS — Z720 Tobacco use: Secondary | ICD-10-CM | POA: Insufficient documentation

## 2014-09-08 DIAGNOSIS — Z880 Allergy status to penicillin: Secondary | ICD-10-CM | POA: Insufficient documentation

## 2014-09-08 DIAGNOSIS — Z3202 Encounter for pregnancy test, result negative: Secondary | ICD-10-CM | POA: Insufficient documentation

## 2014-09-08 DIAGNOSIS — R102 Pelvic and perineal pain: Secondary | ICD-10-CM

## 2014-09-08 DIAGNOSIS — R350 Frequency of micturition: Secondary | ICD-10-CM | POA: Insufficient documentation

## 2014-09-08 DIAGNOSIS — N8320 Unspecified ovarian cysts: Secondary | ICD-10-CM | POA: Insufficient documentation

## 2014-09-08 DIAGNOSIS — N83209 Unspecified ovarian cyst, unspecified side: Secondary | ICD-10-CM

## 2014-09-08 HISTORY — DX: Bipolar disorder, unspecified: F31.9

## 2014-09-08 LAB — COMPREHENSIVE METABOLIC PANEL
ALK PHOS: 62 U/L (ref 38–126)
ALT: 11 U/L — ABNORMAL LOW (ref 14–54)
AST: 15 U/L (ref 15–41)
Albumin: 4.2 g/dL (ref 3.5–5.0)
Anion gap: 4 — ABNORMAL LOW (ref 5–15)
BUN: 11 mg/dL (ref 6–20)
CO2: 23 mmol/L (ref 22–32)
CREATININE: 0.87 mg/dL (ref 0.44–1.00)
Calcium: 8.8 mg/dL — ABNORMAL LOW (ref 8.9–10.3)
Chloride: 112 mmol/L — ABNORMAL HIGH (ref 101–111)
GFR calc Af Amer: >60 mL/min (ref >60)
GFR calc non Af Amer: >60 mL/min (ref >60)
GLUCOSE: 95 mg/dL (ref 65–99)
Potassium: 3.5 mmol/L (ref 3.5–5.1)
SODIUM: 139 mmol/L (ref 135–145)
Total Bilirubin: 0.5 mg/dL (ref 0.3–1.2)
Total Protein: 6.7 g/dL (ref 6.5–8.1)

## 2014-09-08 LAB — URINALYSIS COMPLETE WITH MICROSCOPIC (ARMC ONLY)
Bacteria, UA: NONE SEEN
Bilirubin Urine: NEGATIVE
Glucose, UA: NEGATIVE mg/dL
Hgb urine dipstick: NEGATIVE
Ketones, ur: NEGATIVE mg/dL
LEUKOCYTES UA: NEGATIVE
Nitrite: NEGATIVE
PROTEIN: NEGATIVE mg/dL
Specific Gravity, Urine: 1.024 (ref 1.005–1.030)
pH: 6 (ref 5.0–8.0)

## 2014-09-08 LAB — CBC WITH DIFFERENTIAL/PLATELET
Basophils Absolute: 0 10*3/uL (ref 0–0.1)
Basophils Relative: 0 %
EOS ABS: 0.2 10*3/uL (ref 0–0.7)
Eosinophils Relative: 3 %
HEMATOCRIT: 42.8 % (ref 35.0–47.0)
HEMOGLOBIN: 14.5 g/dL (ref 12.0–16.0)
Lymphocytes Relative: 20 %
Lymphs Abs: 1.3 10*3/uL (ref 1.0–3.6)
MCH: 31.7 pg (ref 26.0–34.0)
MCHC: 33.9 g/dL (ref 32.0–36.0)
MCV: 93.4 fL (ref 80.0–100.0)
MONO ABS: 0.4 10*3/uL (ref 0.2–0.9)
MONOS PCT: 7 %
NEUTROS PCT: 70 %
Neutro Abs: 4.4 10*3/uL (ref 1.4–6.5)
Platelets: 207 10*3/uL (ref 150–440)
RBC: 4.58 MIL/uL (ref 3.80–5.20)
RDW: 12.7 % (ref 11.5–14.5)
WBC: 6.3 10*3/uL (ref 3.6–11.0)

## 2014-09-08 LAB — PREGNANCY, URINE: Preg Test, Ur: NEGATIVE

## 2014-09-08 LAB — POCT PREGNANCY, URINE: PREG TEST UR: NEGATIVE

## 2014-09-08 LAB — LIPASE, BLOOD: Lipase: 33 U/L (ref 22–51)

## 2014-09-08 MED ORDER — HYDROMORPHONE HCL 1 MG/ML IJ SOLN: 1.0000 mg | Freq: Once | INTRAMUSCULAR | Status: DC

## 2014-09-08 MED ORDER — PROMETHAZINE HCL 25 MG PO TABS: 25.0000 mg | ORAL_TABLET | Freq: Four times a day (QID) | ORAL | Status: DC | PRN

## 2014-09-08 MED ORDER — OXYCODONE-ACETAMINOPHEN 7.5-325 MG PO TABS: 1.0000 | ORAL_TABLET | ORAL | Status: DC | PRN

## 2014-09-08 MED ORDER — PROMETHAZINE HCL 25 MG/ML IJ SOLN
25.0000 mg | Freq: Once | INTRAMUSCULAR | Status: AC
  Administered 2014-09-08: 25 mg via INTRAVENOUS

## 2014-09-08 MED ORDER — HYDROMORPHONE HCL 1 MG/ML IJ SOLN
1.0000 mg | Freq: Once | INTRAMUSCULAR | Status: AC
Start: 2014-09-08 — End: 2014-09-08
  Administered 2014-09-08: 1 mg via INTRAVENOUS

## 2014-09-08 MED ORDER — HYDROMORPHONE HCL 1 MG/ML IJ SOLN
INTRAMUSCULAR | Status: AC
  Administered 2014-09-08: 1 mg via INTRAVENOUS
  Filled 2014-09-08: qty 1

## 2014-09-08 MED ORDER — PROMETHAZINE HCL 25 MG PO TABS: 25.0000 mg | ORAL_TABLET | Freq: Once | ORAL | Status: DC

## 2014-09-08 MED ORDER — PROMETHAZINE HCL 25 MG/ML IJ SOLN
INTRAMUSCULAR | Status: AC
  Administered 2014-09-08: 25 mg via INTRAVENOUS
  Filled 2014-09-08: qty 1

## 2014-09-08 MED ORDER — PROMETHAZINE HCL 25 MG/ML IJ SOLN: 25.0000 mg | Freq: Once | INTRAMUSCULAR | Status: DC

## 2014-09-08 MED ORDER — HYDROMORPHONE HCL 1 MG/ML IJ SOLN
1.0000 mg | Freq: Once | INTRAMUSCULAR | Status: AC
  Administered 2014-09-08: 1 mg via INTRAVENOUS

## 2014-09-08 NOTE — ED Notes (Signed)
Patient presents to the ED with right flank pain and right lower quadrant pain x 2 days, urinary frequency and burning pain, nausea, vomiting and diarrhea x 2 weeks.  Patient reports she is very sensitive to smells.

## 2014-09-08 NOTE — ED Provider Notes (Signed)
[facility] Emergency Department Provider Note ____________________________________________  Time seen: Approximately 7:14 PM  I have reviewed the triage vital signs and the nursing notes.   HISTORY  Chief Complaint Urinary Frequency; Flank Pain; Vomiting; and Diarrhea    HPI Angel Brock is a 29 y.o. female patient complaining of right flank and right lower quadrant pain for 2 days. Patient also states urinary frequency and burning. Patient stated this been nausea vomiting diarrhea for 2 weeks. Paced the symptoms worse today so she had to leave work. When asked to point to the location of pain she is pointing to the right pelvic area set of the abdominal area. Patient is rating her pelvic pain as a 10 over 10.   Past Medical History  Diagnosis Date  . Asthma   . Headache   . Bipolar 1 disorder     There are no active problems to display for this patient.   Past Surgical History  Procedure Laterality Date  . Tonsillectomy    . Appendectomy    . Tubal ligation      Current Outpatient Rx  Name  Route  Sig  Dispense  Refill  . acetaminophen (TYLENOL) 325 MG tablet   Oral   Take 650 mg by mouth every 6 (six) hours as needed. For pain         . ibuprofen (ADVIL,MOTRIN) 200 MG tablet   Oral   Take 200 mg by mouth every 6 (six) hours as needed.         Marland Kitchen oxyCODONE-acetaminophen (PERCOCET) 7.5-325 MG per tablet   Oral   Take 1 tablet by mouth every 4 (four) hours as needed for severe pain.   20 tablet   0   . oxyCODONE-acetaminophen (ROXICET) 5-325 MG per tablet   Oral   Take 1 tablet by mouth every 6 (six) hours as needed.   6 tablet   0     Allergies Geodon; Norco; Penicillins; Zofran; and Rocephin  No family history on file.  Social History History  Substance Use Topics  . Smoking status: Current Every Day Smoker -- 0.50 packs/day    Types: Cigarettes  . Smokeless tobacco: Not on file  . Alcohol Use: Yes     Comment:  occasional    Review of Systems Constitutional: No fever/chills Eyes: No visual changes. ENT: No sore throat. Cardiovascular: Denies chest pain. Respiratory: Denies shortness of breath. Gastrointestinal: No abdominal pain.  No nausea, no vomiting.  No diarrhea.  No constipation. Genitourinary: Right pelvic pain  Musculoskeletal: I flank pain. Skin: Negative for rash. Neurological: Negative for headaches, focal weakness or numbness. {10-point ROS otherwise negative.  ____________________________________________   PHYSICAL EXAM:  VITAL SIGNS: ED Triage Vitals  Enc Vitals Group     BP 09/08/14 1751 127/74 mmHg     Pulse Rate 09/08/14 1751 84     Resp 09/08/14 1751 16     Temp 09/08/14 1751 99.1 F (37.3 C)     Temp Source 09/08/14 1751 Oral     SpO2 09/08/14 1751 100 %     Weight 09/08/14 1751 190 lb (86.183 kg)     Height 09/08/14 1751  (1.549 m)     Head Cir --      Peak Flow --      Pain Score 09/08/14 1753 10     Pain Loc --      Pain Edu? --      Excl. in GC? --  Constitutional: Alert and oriented. Moderate distress Eyes: Conjunctivae are normal. PERRL. EOMI. Head: Atraumatic. Nose: No congestion/rhinnorhea. Mouth/Throat: Mucous membranes are moist.  Oropharynx non-erythematous. Neck: No stridor. No deformity for nuchal range of motion nontender palpation Hematological/Lymphatic/Immunilogical: No cervical lymphadenopathy. Cardiovascular: Normal rate, regular rhythm. Grossly normal heart sounds.  Good peripheral circulation. Respiratory: Normal respiratory effort.  No retractions. Lungs CTAB. Gastrointestinal: Soft and nontender. No distention. No abdominal bruits. No CVA tenderness. Genitourinary: No edema. Moderate guarding palpation and right pelvic area. Musculoskeletal: No lower extremity tenderness nor edema.  No joint effusions. Neurologic:  Normal speech and language. No gross focal neurologic deficits are appreciated. Speech is normal. No gait  instability. Skin:  Skin is warm, dry and intact. No rash noted. Psychiatric: Mood and affect are normal. Speech and behavior are normal.  ____________________________________________   LABS (all labs ordered are listed, but only abnormal results are displayed)  Labs Reviewed  COMPREHENSIVE METABOLIC PANEL - Abnormal; Notable for the following:    Chloride 112 (*)    Calcium 8.8 (*)    ALT 11 (*)    Anion gap 4 (*)    All other components within normal limits  URINALYSIS COMPLETEWITH MICROSCOPIC ([facility] ONLY) - Abnormal; Notable for the following:    Color, Urine YELLOW (*)    APPearance CLEAR (*)    Squamous Epithelial / LPF 0-5 (*)    All other components within normal limits  LIPASE, BLOOD  CBC WITH DIFFERENTIAL/PLATELET  PREGNANCY, URINE  POCT PREGNANCY, URINE   ____________________________________________  EKG   ____________________________________________  RADIOLOGY  Ultrasound reveals a residues of a ruptured ovarian cyst on the right side. ____________________________________________   PROCEDURES  Procedure(s) performed: None  Critical Care performed: No  ____________________________________________   INITIAL IMPRESSION / ASSESSMENT AND PLAN / ED COURSE  Pertinent labs & imaging results that were available during my care of the patient were reviewed by me and considered in my medical decision making (see chart for details).  Right pelvic pain ____________________________________________   FINAL CLINICAL IMPRESSION(S) / ED DIAGNOSES  Final diagnoses:  Pelvic pain in female  Ruptured ovarian cyst      Joni Reining, PA-C 09/08/14 2242  Governor Rooks, MD 09/08/14 [phone]

## 2014-09-08 NOTE — ED Notes (Signed)
Patient present with nausea and vomiting x 3 weeks but states Tuesday symptoms became worse and have not relieved. Patient states shooting pain in right groin and right flank pain and ncreased urinary frequency. Patient reports decreased appetite.  Patient denies vaginal discharge, odor or dysuria, chest pain, short of breath. Patient alert and oriented, speaking in complete sentences. Respirations even and unlabored.

## 2014-10-31 ENCOUNTER — Emergency Department: Payer: Self-pay

## 2014-10-31 ENCOUNTER — Emergency Department
Admission: EM | Admit: 2014-10-31 | Discharge: 2014-10-31 | Disposition: A | Discharge status: Home / Self Care | Payer: Self-pay | Attending: Emergency Medicine | Admitting: Emergency Medicine

## 2014-10-31 ENCOUNTER — Encounter: Payer: Self-pay | Admitting: Emergency Medicine

## 2014-10-31 DIAGNOSIS — R519 Headache, unspecified: Secondary | ICD-10-CM

## 2014-10-31 DIAGNOSIS — Z3202 Encounter for pregnancy test, result negative: Secondary | ICD-10-CM | POA: Insufficient documentation

## 2014-10-31 DIAGNOSIS — R51 Headache: Secondary | ICD-10-CM

## 2014-10-31 DIAGNOSIS — Z720 Tobacco use: Secondary | ICD-10-CM | POA: Insufficient documentation

## 2014-10-31 DIAGNOSIS — Z880 Allergy status to penicillin: Secondary | ICD-10-CM | POA: Insufficient documentation

## 2014-10-31 DIAGNOSIS — G43909 Migraine, unspecified, not intractable, without status migrainosus: Secondary | ICD-10-CM | POA: Insufficient documentation

## 2014-10-31 DIAGNOSIS — F419 Anxiety disorder, unspecified: Secondary | ICD-10-CM | POA: Insufficient documentation

## 2014-10-31 LAB — CBC WITH DIFFERENTIAL/PLATELET
Basophils Absolute: 0 10*3/uL (ref 0–0.1)
Basophils Relative: 0 %
EOS ABS: 0.3 10*3/uL (ref 0–0.7)
EOS PCT: 3 %
HEMATOCRIT: 40.4 % (ref 35.0–47.0)
Hemoglobin: 13.9 g/dL (ref 12.0–16.0)
LYMPHS ABS: 2.2 10*3/uL (ref 1.0–3.6)
LYMPHS PCT: 28 %
MCH: 32 pg (ref 26.0–34.0)
MCHC: 34.6 g/dL (ref 32.0–36.0)
MCV: 92.6 fL (ref 80.0–100.0)
MONO ABS: 0.6 10*3/uL (ref 0.2–0.9)
Monocytes Relative: 8 %
NEUTROS PCT: 61 %
Neutro Abs: 4.7 10*3/uL (ref 1.4–6.5)
Platelets: 211 10*3/uL (ref 150–440)
RBC: 4.36 MIL/uL (ref 3.80–5.20)
RDW: 13 % (ref 11.5–14.5)
WBC: 7.8 10*3/uL (ref 3.6–11.0)

## 2014-10-31 LAB — COMPREHENSIVE METABOLIC PANEL
ALBUMIN: 4 g/dL (ref 3.5–5.0)
ALT: 13 U/L — ABNORMAL LOW (ref 14–54)
ANION GAP: 8 (ref 5–15)
AST: 16 U/L (ref 15–41)
Alkaline Phosphatase: 59 U/L (ref 38–126)
BUN: 14 mg/dL (ref 6–20)
CALCIUM: 9.1 mg/dL (ref 8.9–10.3)
CHLORIDE: 109 mmol/L (ref 101–111)
CO2: 20 mmol/L — ABNORMAL LOW (ref 22–32)
Creatinine, Ser: 0.82 mg/dL (ref 0.44–1.00)
GFR calc Af Amer: >60 mL/min (ref >60)
Glucose, Bld: 96 mg/dL (ref 65–99)
Potassium: 3.5 mmol/L (ref 3.5–5.1)
Sodium: 137 mmol/L (ref 135–145)
TOTAL PROTEIN: 6.7 g/dL (ref 6.5–8.1)
Total Bilirubin: 0.2 mg/dL — ABNORMAL LOW (ref 0.3–1.2)

## 2014-10-31 LAB — PREGNANCY, URINE: PREG TEST UR: NEGATIVE

## 2014-10-31 LAB — POCT PREGNANCY, URINE: Preg Test, Ur: NEGATIVE

## 2014-10-31 MED ORDER — METOCLOPRAMIDE HCL 5 MG/ML IJ SOLN
10.0000 mg | Freq: Once | INTRAMUSCULAR | Status: AC
  Administered 2014-10-31: 10 mg via INTRAVENOUS

## 2014-10-31 MED ORDER — KETOROLAC TROMETHAMINE 30 MG/ML IJ SOLN
30.0000 mg | Freq: Once | INTRAMUSCULAR | Status: AC
  Administered 2014-10-31: 30 mg via INTRAVENOUS

## 2014-10-31 MED ORDER — KETOROLAC TROMETHAMINE 30 MG/ML IJ SOLN
INTRAMUSCULAR | Status: AC
  Filled 2014-10-31: qty 1

## 2014-10-31 MED ORDER — DIPHENHYDRAMINE HCL 50 MG/ML IJ SOLN
25.0000 mg | Freq: Once | INTRAMUSCULAR | Status: AC
  Administered 2014-10-31: 25 mg via INTRAVENOUS

## 2014-10-31 MED ORDER — LORAZEPAM 2 MG/ML IJ SOLN
1.0000 mg | Freq: Once | INTRAMUSCULAR | Status: AC
  Administered 2014-10-31: 1 mg via INTRAVENOUS

## 2014-10-31 MED ORDER — METOCLOPRAMIDE HCL 5 MG/ML IJ SOLN
INTRAMUSCULAR | Status: AC
  Filled 2014-10-31: qty 2

## 2014-10-31 MED ORDER — SODIUM CHLORIDE 0.9 % IV SOLN
Freq: Once | INTRAVENOUS | Status: AC
  Administered 2014-10-31: 1 mL/h via INTRAVENOUS

## 2014-10-31 MED ORDER — DIPHENHYDRAMINE HCL 50 MG/ML IJ SOLN
INTRAMUSCULAR | Status: AC
  Filled 2014-10-31: qty 1

## 2014-10-31 MED ORDER — LORAZEPAM 2 MG/ML IJ SOLN
INTRAMUSCULAR | Status: AC
  Filled 2014-10-31: qty 1

## 2014-10-31 MED ORDER — LORAZEPAM 1 MG PO TABS: 1.0000 mg | ORAL_TABLET | Freq: Two times a day (BID) | ORAL | Status: DC

## 2014-10-31 NOTE — ED Provider Notes (Signed)
[facility] Emergency Department Provider Note     Time seen: ----------------------------------------- 6:08 PM on 10/31/2014 -----------------------------------------    I have reviewed the triage vital signs and the nursing notes.   HISTORY  Chief Complaint Migraine    HPI Angel Brock is a 29 y.o. female presents ER for headache for 2 weeks, describes it as sharp stabbing pain in the back of the head to the neck area. Also reports some nausea and blurred vision. Reports migraines 10 years ago she was pregnant. Light and sound does bother her, she feels shaky and is unclear why this keeps happening to her.   Past Medical History  Diagnosis Date  . Asthma   . Headache   . Bipolar 1 disorder     There are no active problems to display for this patient.   Past Surgical History  Procedure Laterality Date  . Tonsillectomy    . Appendectomy    . Tubal ligation      Allergies Geodon; Norco; Penicillins; Zofran; and Rocephin  Social History History  Substance Use Topics  . Smoking status: Current Every Day Smoker -- 0.50 packs/day    Types: Cigarettes  . Smokeless tobacco: Not on file  . Alcohol Use: No     Comment: occasional    Review of Systems Constitutional: Negative for fever. Eyes: Positive for blurry vision ENT: Negative for sore throat. Cardiovascular: Negative for chest pain. Respiratory: Negative for shortness of breath. Gastrointestinal: Negative for abdominal pain, positive for nausea Genitourinary: Negative for dysuria. Musculoskeletal: Negative for back pain. Skin: Negative for rash. Neurological: Positive for headache, positive for weakness  10-point ROS otherwise negative.  ____________________________________________   PHYSICAL EXAM:  VITAL SIGNS: ED Triage Vitals  Enc Vitals Group     BP 10/31/14 1740 140/85 mmHg     Pulse Rate 10/31/14 1740 97     Resp 10/31/14 1740 18     Temp 10/31/14 1740 98.2 F  (36.8 C)     Temp Source 10/31/14 1740 Oral     SpO2 10/31/14 1740 98 %     Weight 10/31/14 1740 190 lb (86.183 kg)     Height 10/31/14 1740 5\' 1"  (1.549 m)     Head Cir --      Peak Flow --      Pain Score 10/31/14 1741 10     Pain Loc --      Pain Edu? --      Excl. in GC? --     Constitutional: Alert and oriented. Well appearing and in no distress. Eyes: Conjunctivae are normal. PERRL. Normal extraocular movements. ENT   Head: Normocephalic and atraumatic.   Nose: No congestion/rhinnorhea.   Mouth/Throat: Mucous membranes are moist.   Neck: No stridor. Cardiovascular: Normal rate, regular rhythm. Normal and symmetric distal pulses are present in all extremities. No murmurs, rubs, or gallops. Respiratory: Normal respiratory effort without tachypnea nor retractions. Breath sounds are clear and equal bilaterally. No wheezes/rales/rhonchi. Gastrointestinal: Soft and nontender. No distention. No abdominal bruits.  Musculoskeletal: Nontender with normal range of motion in all extremities. No joint effusions.  No lower extremity tenderness nor edema. Neurologic:  Normal speech and language. No gross focal neurologic deficits are appreciated. Speech is normal. No gait instability. Skin:  Skin is warm, dry and intact. No rash noted. Psychiatric: Mood and affect are normal. Speech and behavior are normal. Patient exhibits appropriate insight and judgment. Patient appears anxious  ____________________________________________  ED COURSE:  Pertinent labs &  imaging results that were available during my care of the patient were reviewed by me and considered in my medical decision making (see chart for details). Unclear etiology for headaches, patient states she's never had a brain scan, will consider doing this. Also patient will receive IV fluids, Reglan, Toradol and Benadryl. ____________________________________________    LABS (pertinent positives/negatives)  Labs Reviewed   COMPREHENSIVE METABOLIC PANEL - Abnormal; Notable for the following:    CO2 20 (*)    ALT 13 (*)    Total Bilirubin 0.2 (*)    All other components within normal limits  CBC WITH DIFFERENTIAL/PLATELET  PREGNANCY, URINE  POCT PREGNANCY, URINE    RADIOLOGY Images were viewed by me  CT head is unremarkable  ____________________________________________  FINAL ASSESSMENT AND PLAN  Migraine  Plan: Patient with labs and imaging as dictated above. Patient feeling better, likely some anxiety component. Will discharge with Ativan to take as needed she can follow-up with her doctor.   [name], MD   [name], MD 10/31/14 [phone]

## 2014-10-31 NOTE — Discharge Instructions (Signed)
General Headache Without Cause °A headache is pain or discomfort felt around the head or neck area. The specific cause of a headache may not be found. There are many causes and types of headaches. A few common ones are: °· Tension headaches. °· Migraine headaches. °· Cluster headaches. °· Chronic daily headaches. °HOME CARE INSTRUCTIONS  °· Keep all follow-up appointments with your caregiver or any specialist referral. °· Only take over-the-counter or prescription medicines for pain or discomfort as directed by your caregiver. °· Lie down in a dark, quiet room when you have a headache. °· Keep a headache journal to find out what may trigger your migraine headaches. For example, write down: °· What you eat and drink. °· How much sleep you get. °· Any change to your diet or medicines. °· Try massage or other relaxation techniques. °· Put ice packs or heat on the head and neck. Use these 3 to 4 times per day for 15 to 20 minutes each time, or as needed. °· Limit stress. °· Sit up straight, and do not tense your muscles. °· Quit smoking if you smoke. °· Limit alcohol use. °· Decrease the amount of caffeine you drink, or stop drinking caffeine. °· Eat and sleep on a regular schedule. °· Get 7 to 9 hours of sleep, or as recommended by your caregiver. °· Keep lights dim if bright lights bother you and make your headaches worse. °SEEK MEDICAL CARE IF:  °· You have problems with the medicines you were prescribed. °· Your medicines are not working. °· You have a change from the usual headache. °· You have nausea or vomiting. °SEEK IMMEDIATE MEDICAL CARE IF:  °· Your headache becomes severe. °· You have a fever. °· You have a stiff neck. °· You have loss of vision. °· You have muscular weakness or loss of muscle control. °· You start losing your balance or have trouble walking. °· You feel faint or pass out. °· You have severe symptoms that are different from your first symptoms. °MAKE SURE YOU:  °· Understand these  instructions. °· Will watch your condition. °· Will get help right away if you are not doing well or get worse. °Document Released: 03/11/2005 Document Revised: 06/03/2011 Document Reviewed: 03/27/2011 °[employer]® Patient Information ©2015 [employer]. This information is not intended to replace advice given to you by your health care provider. Make sure you discuss any questions you have with your health care provider. ° °Panic Attacks °Panic attacks are sudden, short-lived surges of severe anxiety, fear, or discomfort. They may occur for no reason when you are relaxed, when you are anxious, or when you are sleeping. Panic attacks may occur for a number of reasons:  °· Healthy people occasionally have panic attacks in extreme, life-threatening situations, such as war or natural disasters. Normal anxiety is a protective mechanism of the body that helps us react to danger (fight or flight response). °· Panic attacks are often seen with anxiety disorders, such as panic disorder, social anxiety disorder, generalized anxiety disorder, and phobias. Anxiety disorders cause excessive or uncontrollable anxiety. They may interfere with your relationships or other life activities. °· Panic attacks are sometimes seen with other mental illnesses, such as depression and posttraumatic stress disorder. °· Certain medical conditions, prescription medicines, and drugs of abuse can cause panic attacks. °SYMPTOMS  °Panic attacks start suddenly, peak within 20 minutes, and are accompanied by four or more of the following symptoms: °· Pounding heart or fast heart rate (palpitations). °· Sweating. °· Trembling   or shaking. °· Shortness of breath or feeling smothered. °· Feeling choked. °· Chest pain or discomfort. °· Nausea or strange feeling in your stomach. °· Dizziness, light-headedness, or feeling like you will faint. °· Chills or hot flushes. °· Numbness or tingling in your lips or hands and feet. °· Feeling that things are not real  or feeling that you are not yourself. °· Fear of losing control or going crazy. °· Fear of dying. °Some of these symptoms can mimic serious medical conditions. For example, you may think you are having a heart attack. Although panic attacks can be very scary, they are not life threatening. °DIAGNOSIS  °Panic attacks are diagnosed through an assessment by your health care provider. Your health care provider will ask questions about your symptoms, such as where and when they occurred. Your health care provider will also ask about your medical history and use of alcohol and drugs, including prescription medicines. Your health care provider may order blood tests or other studies to rule out a serious medical condition. Your health care provider may refer you to a mental health professional for further evaluation. °TREATMENT  °· Most healthy people who have one or two panic attacks in an extreme, life-threatening situation will not require treatment. °· The treatment for panic attacks associated with anxiety disorders or other mental illness typically involves counseling with a mental health professional, medicine, or a combination of both. Your health care provider will help determine what treatment is best for you. °· Panic attacks due to physical illness usually go away with treatment of the illness. If prescription medicine is causing panic attacks, talk with your health care provider about stopping the medicine, decreasing the dose, or substituting another medicine. °· Panic attacks due to alcohol or drug abuse go away with abstinence. Some adults need professional help in order to stop drinking or using drugs. °HOME CARE INSTRUCTIONS  °· Take all medicines as directed by your health care provider.   °· Schedule and attend follow-up visits as directed by your health care provider. It is important to keep all your appointments. °SEEK MEDICAL CARE IF: °· You are not able to take your medicines as prescribed. °· Your  symptoms do not improve or get worse. °SEEK IMMEDIATE MEDICAL CARE IF:  °· You experience panic attack symptoms that are different than your usual symptoms. °· You have serious thoughts about hurting yourself or others. °· You are taking medicine for panic attacks and have a serious side effect. °MAKE SURE YOU: °· Understand these instructions. °· Will watch your condition. °· Will get help right away if you are not doing well or get worse. °Document Released: 03/11/2005 Document Revised: 03/16/2013 Document Reviewed: 10/23/2012 °[employer]® Patient Information ©2015 [employer]. This information is not intended to replace advice given to you by your health care provider. Make sure you discuss any questions you have with your health care provider. ° °

## 2014-10-31 NOTE — ED Notes (Signed)
C/o headache x 2 weeks, describes pain as "shooting pain to back of head and neck", also having some nausea and blurred vision, has hx of migraines 10 years ago when she was pregnant

## 2014-11-21 ENCOUNTER — Emergency Department
Admission: EM | Admit: 2014-11-21 | Discharge: 2014-11-21 | Disposition: A | Discharge status: Home / Self Care | Payer: Self-pay

## 2014-11-21 LAB — POCT PREGNANCY, URINE: Preg Test, Ur: NEGATIVE

## 2014-11-27 ENCOUNTER — Encounter: Payer: Self-pay | Admitting: Emergency Medicine

## 2014-11-27 ENCOUNTER — Emergency Department
Admission: EM | Admit: 2014-11-27 | Discharge: 2014-11-27 | Disposition: A | Discharge status: Home / Self Care | Payer: Self-pay | Attending: Emergency Medicine | Admitting: Emergency Medicine

## 2014-11-27 DIAGNOSIS — R1031 Right lower quadrant pain: Secondary | ICD-10-CM | POA: Insufficient documentation

## 2014-11-27 DIAGNOSIS — B9689 Other specified bacterial agents as the cause of diseases classified elsewhere: Secondary | ICD-10-CM

## 2014-11-27 DIAGNOSIS — Z720 Tobacco use: Secondary | ICD-10-CM | POA: Insufficient documentation

## 2014-11-27 DIAGNOSIS — Z880 Allergy status to penicillin: Secondary | ICD-10-CM | POA: Insufficient documentation

## 2014-11-27 DIAGNOSIS — Z3202 Encounter for pregnancy test, result negative: Secondary | ICD-10-CM | POA: Insufficient documentation

## 2014-11-27 DIAGNOSIS — Z79899 Other long term (current) drug therapy: Secondary | ICD-10-CM | POA: Insufficient documentation

## 2014-11-27 DIAGNOSIS — N760 Acute vaginitis: Secondary | ICD-10-CM | POA: Insufficient documentation

## 2014-11-27 HISTORY — DX: Unspecified ovarian cyst, left side: N83.202

## 2014-11-27 HISTORY — DX: Unspecified ovarian cyst, right side: N83.201

## 2014-11-27 LAB — WET PREP, GENITAL
TRICH WET PREP: NONE SEEN
YEAST WET PREP: NONE SEEN

## 2014-11-27 LAB — URINALYSIS COMPLETE WITH MICROSCOPIC (ARMC ONLY)
BACTERIA UA: NONE SEEN
BILIRUBIN URINE: NEGATIVE
GLUCOSE, UA: NEGATIVE mg/dL
Ketones, ur: NEGATIVE mg/dL
Leukocytes, UA: NEGATIVE
Nitrite: NEGATIVE
Protein, ur: NEGATIVE mg/dL
RBC / HPF: NONE SEEN RBC/hpf (ref 0–5)
Specific Gravity, Urine: 1.003 — ABNORMAL LOW (ref 1.005–1.030)
pH: 6 (ref 5.0–8.0)

## 2014-11-27 LAB — POCT PREGNANCY, URINE: PREG TEST UR: NEGATIVE

## 2014-11-27 LAB — LIPASE, BLOOD: Lipase: 32 U/L (ref 22–51)

## 2014-11-27 LAB — COMPREHENSIVE METABOLIC PANEL
ALBUMIN: 3.7 g/dL (ref 3.5–5.0)
ALT: 14 U/L (ref 14–54)
AST: 19 U/L (ref 15–41)
Alkaline Phosphatase: 67 U/L (ref 38–126)
Anion gap: 7 (ref 5–15)
BILIRUBIN TOTAL: 0.3 mg/dL (ref 0.3–1.2)
BUN: 11 mg/dL (ref 6–20)
CHLORIDE: 109 mmol/L (ref 101–111)
CO2: 25 mmol/L (ref 22–32)
CREATININE: 0.85 mg/dL (ref 0.44–1.00)
Calcium: 8.8 mg/dL — ABNORMAL LOW (ref 8.9–10.3)
GFR calc Af Amer: >60 mL/min (ref >60)
GLUCOSE: 98 mg/dL (ref 65–99)
POTASSIUM: 3.8 mmol/L (ref 3.5–5.1)
Sodium: 141 mmol/L (ref 135–145)
TOTAL PROTEIN: 6.2 g/dL — AB (ref 6.5–8.1)

## 2014-11-27 LAB — CBC
HEMATOCRIT: 39.9 % (ref 35.0–47.0)
Hemoglobin: 13.7 g/dL (ref 12.0–16.0)
MCH: 32.4 pg (ref 26.0–34.0)
MCHC: 34.4 g/dL (ref 32.0–36.0)
MCV: 94.2 fL (ref 80.0–100.0)
PLATELETS: 199 10*3/uL (ref 150–440)
RBC: 4.24 MIL/uL (ref 3.80–5.20)
RDW: 12.7 % (ref 11.5–14.5)
WBC: 6.6 10*3/uL (ref 3.6–11.0)

## 2014-11-27 LAB — CHLAMYDIA/NGC RT PCR (ARMC ONLY)
Chlamydia Tr: NOT DETECTED
N gonorrhoeae: NOT DETECTED

## 2014-11-27 MED ORDER — KETOROLAC TROMETHAMINE 30 MG/ML IJ SOLN
30.0000 mg | Freq: Once | INTRAMUSCULAR | Status: AC
  Administered 2014-11-27: 30 mg via INTRAVENOUS
  Filled 2014-11-27: qty 1

## 2014-11-27 MED ORDER — ONDANSETRON HCL 4 MG/2ML IJ SOLN
4.0000 mg | Freq: Once | INTRAMUSCULAR | Status: AC
Start: 2014-11-27 — End: 2014-11-27
  Administered 2014-11-27: 4 mg via INTRAVENOUS
  Filled 2014-11-27: qty 2

## 2014-11-27 MED ORDER — MORPHINE SULFATE (PF) 4 MG/ML IV SOLN
4.0000 mg | Freq: Once | INTRAVENOUS | Status: AC
  Administered 2014-11-27: 4 mg via INTRAVENOUS
  Filled 2014-11-27: qty 1

## 2014-11-27 MED ORDER — METRONIDAZOLE 500 MG PO TABS: 500.0000 mg | ORAL_TABLET | Freq: Two times a day (BID) | ORAL | Status: DC

## 2014-11-27 NOTE — ED Notes (Signed)
Pt tearful , abd/ pelvic pain with vaginal bleeding , LMP x1 month ago, nausea x1 week, bilateral breast drainage ( yellowish discharge)

## 2014-11-27 NOTE — ED Notes (Signed)
Pt reports RLQ pain today; Nausea and vomiting, LMP 8/10; Pt reports heavy vaginal bleeding.

## 2014-11-27 NOTE — Discharge Instructions (Signed)
You have been seen in the Emergency Department (ED) for abdominal pain.  Your evaluation did not identify a clear cause of your symptoms but was generally reassuring.  We believe you are likely suffering again from pain related to ovarian cysts and your menstrual cycle.  Please take over-the-counter ibuprofen and/or Tylenol for your symptoms.  Please follow up as instructed above regarding todays emergent visit and the symptoms that are bothering you.  It is important that you establish a primary care doctor.  Return to the ED if your abdominal pain worsens or fails to improve, you develop bloody vomiting, bloody diarrhea, you are unable to tolerate fluids due to vomiting, fever greater than 101, or other symptoms that concern you.   Abdominal Pain, Women Abdominal (stomach, pelvic, or belly) pain can be caused by many things. It is important to tell your doctor:  The location of the pain.  Does it come and go or is it present all the time?  Are there things that start the pain (eating certain foods, exercise)?  Are there other symptoms associated with the pain (fever, nausea, vomiting, diarrhea)? All of this is helpful to know when trying to find the cause of the pain. CAUSES   Stomach: virus or bacteria infection, or ulcer.  Intestine: appendicitis (inflamed appendix), regional ileitis (Crohn's disease), ulcerative colitis (inflamed colon), irritable bowel syndrome, diverticulitis (inflamed diverticulum of the colon), or cancer of the stomach or intestine.  Gallbladder disease or stones in the gallbladder.  Kidney disease, kidney stones, or infection.  Pancreas infection or cancer.  Fibromyalgia (pain disorder).  Diseases of the female organs:  Uterus: fibroid (non-cancerous) tumors or infection.  Fallopian tubes: infection or tubal pregnancy.  Ovary: cysts or tumors.  Pelvic adhesions (scar tissue).  Endometriosis (uterus lining tissue growing in the pelvis and on the  pelvic organs).  Pelvic congestion syndrome (female organs filling up with blood just before the menstrual period).  Pain with the menstrual period.  Pain with ovulation (producing an egg).  Pain with an IUD (intrauterine device, birth control) in the uterus.  Cancer of the female organs.  Functional pain (pain not caused by a disease, may improve without treatment).  Psychological pain.  Depression. DIAGNOSIS  Your doctor will decide the seriousness of your pain by doing an examination.  Blood tests.  X-rays.  Ultrasound.  CT scan (computed tomography, special type of X-ray).  MRI (magnetic resonance imaging).  Cultures, for infection.  Barium enema (dye inserted in the large intestine, to better view it with X-rays).  Colonoscopy (looking in intestine with a lighted tube).  Laparoscopy (minor surgery, looking in abdomen with a lighted tube).  Major abdominal exploratory surgery (looking in abdomen with a large incision). TREATMENT  The treatment will depend on the cause of the pain.   Many cases can be observed and treated at home.  Over-the-counter medicines recommended by your caregiver.  Prescription medicine.  Antibiotics, for infection.  Birth control pills, for painful periods or for ovulation pain.  Hormone treatment, for endometriosis.  Nerve blocking injections.  Physical therapy.  Antidepressants.  Counseling with a psychologist or psychiatrist.  Minor or major surgery. HOME CARE INSTRUCTIONS   Do not take laxatives, unless directed by your caregiver.  Take over-the-counter pain medicine only if ordered by your caregiver. Do not take aspirin because it can cause an upset stomach or bleeding.  Try a clear liquid diet (broth or water) as ordered by your caregiver. Slowly move to a bland diet, as  tolerated, if the pain is related to the stomach or intestine.  Have a thermometer and take your temperature several times a day, and record  it.  Bed rest and sleep, if it helps the pain.  Avoid sexual intercourse, if it causes pain.  Avoid stressful situations.  Keep your follow-up appointments and tests, as your caregiver orders.  If the pain does not go away with medicine or surgery, you may try:  Acupuncture.  Relaxation exercises (yoga, meditation).  Group therapy.  Counseling. SEEK MEDICAL CARE IF:   You notice certain foods cause stomach pain.  Your home care treatment is not helping your pain.  You need stronger pain medicine.  You want your IUD removed.  You feel faint or lightheaded.  You develop nausea and vomiting.  You develop a rash.  You are having side effects or an allergy to your medicine. SEEK IMMEDIATE MEDICAL CARE IF:   Your pain does not go away or gets worse.  You have a fever.  Your pain is felt only in portions of the abdomen. The right side could possibly be appendicitis. The left lower portion of the abdomen could be colitis or diverticulitis.  You are passing blood in your stools (bright red or black tarry stools, with or without vomiting).  You have blood in your urine.  You develop chills, with or without a fever.  You pass out. MAKE SURE YOU:   Understand these instructions.  Will watch your condition.  Will get help right away if you are not doing well or get worse. Document Released: 01/06/2007 Document Revised: 07/26/2013 Document Reviewed: 01/26/2009 [facility] Patient Information 2015 [place], [place]. This information is not intended to replace advice given to you by your health care provider. Make sure you discuss any questions you have with your health care provider.  Bacterial Vaginosis Bacterial vaginosis is an infection of the vagina. It happens when too many of certain germs (bacteria) grow in the vagina. HOME CARE  Take your medicine as told by your doctor.  Finish your medicine even if you start to feel better.  Do not have sex until you finish  your medicine and are better.  Tell your sex partner that you have an infection. They should see their doctor for treatment.  Practice safe sex. Use condoms. Have only one sex partner. GET HELP IF:  You are not getting better after 3 days of treatment.  You have more grey fluid (discharge) coming from your vagina than before.  You have more pain than before.  You have a fever. MAKE SURE YOU:   Understand these instructions.  Will watch your condition.  Will get help right away if you are not doing well or get worse. Document Released: 12/19/2007 Document Revised: 12/30/2012 Document Reviewed: 10/21/2012 [facility] Patient Information 2015 [place], [place]. This information is not intended to replace advice given to you by your health care provider. Make sure you discuss any questions you have with your health care provider.

## 2014-11-27 NOTE — ED Provider Notes (Signed)
[facility] Emergency Department Provider Note  ____________________________________________  Time seen: Approximately 3:48 PM  I have reviewed the triage vital signs and the nursing notes.   HISTORY  Chief Complaint Abdominal Pain    HPI Angel Brock is a 29 y.o. female with a history of bipolar disorder, chronic headaches, and who has had multiple emergency department visits over the last several months for a variety of complaints presents today with acute onset of severe right lower quadrant pain.  She states that she has had similar pain in the past due to "ovarian cysts that burst".She saw her OB/GYN in June 2015 for the same and was advised to follow-up with GYN as an outpatient but she says she has not done so because she does not have insurance.  Her last menstrual period was about one month ago and she started having vaginal bleeding today.  She presents quite dramatically, moaning and crying in the bed, but with conversation and distraction she calms down and has a normal conversation.  She says that she has had some nausea and vomiting but does not remember how many times she has thrown up.  She denies dysuria, chest pain, shortness of breath.  Nothing makes the pain better and nothing makes it worse.   Past Medical History  Diagnosis Date  . Asthma   . Headache   . Bipolar 1 disorder   . Bilateral ovarian cysts     There are no active problems to display for this patient.   Past Surgical History  Procedure Laterality Date  . Tonsillectomy    . Appendectomy    . Tubal ligation      Current Outpatient Rx  Name  Route  Sig  Dispense  Refill  . acetaminophen (TYLENOL) 325 MG tablet   Oral   Take 650 mg by mouth every 6 (six) hours as needed for mild pain, moderate pain or headache. For pain         . albuterol (PROVENTIL HFA;VENTOLIN HFA) 108 (90 BASE) MCG/ACT inhaler   Inhalation   Inhale 2 puffs into the lungs every 6 (six) hours as  needed for wheezing or shortness of breath.         Marland Kitchen ibuprofen (ADVIL,MOTRIN) 200 MG tablet   Oral   Take 200 mg by mouth every 6 (six) hours as needed for mild pain or moderate pain (headaches).          . promethazine (PHENERGAN) 25 MG tablet   Oral   Take 1 tablet (25 mg total) by mouth every 6 (six) hours as needed for nausea or vomiting.   30 tablet   0   . LORazepam (ATIVAN) 1 MG tablet   Oral   Take 1 tablet (1 mg total) by mouth 2 (two) times daily.   20 tablet   0   . oxyCODONE-acetaminophen (PERCOCET) 7.5-325 MG per tablet   Oral   Take 1 tablet by mouth every 4 (four) hours as needed for severe pain.   20 tablet   0   . oxyCODONE-acetaminophen (ROXICET) 5-325 MG per tablet   Oral   Take 1 tablet by mouth every 6 (six) hours as needed.   6 tablet   0     Allergies 2,4-d dimethylamine (amisol); Geodon; Norco; Tramadol; Penicillins; Zofran; and Rocephin  No family history on file.  Social History Social History  Substance Use Topics  . Smoking status: Current Every Day Smoker -- 0.50 packs/day  Types: Cigarettes  . Smokeless tobacco: None  . Alcohol Use: No     Comment: occasional    Review of Systems Constitutional: No fever/chills Eyes: No visual changes. ENT: No sore throat. Cardiovascular: Denies chest pain. Respiratory: Denies shortness of breath. Gastrointestinal: Acute onset of severe right lower quadrant abdominal pain with nausea and some vomiting.  No bowel changes. Genitourinary: Negative for dysuria. Musculoskeletal: Negative for back pain. Skin: Negative for rash. Neurological: Negative for headaches, focal weakness or numbness.  10-point ROS otherwise negative.  ____________________________________________   PHYSICAL EXAM:  VITAL SIGNS: ED Triage Vitals  Enc Vitals Group     BP 11/27/14 1358 115/78 mmHg     Pulse Rate 11/27/14 1358 82     Resp 11/27/14 1358 16     Temp 11/27/14 1358 98 F (36.7 C)     Temp Source  11/27/14 1358 Oral     SpO2 11/27/14 1358 97 %     Weight 11/27/14 1358 190 lb (86.183 kg)     Height 11/27/14 1358 5\' 1"  (1.549 m)     Head Cir --      Peak Flow --      Pain Score 11/27/14 1358 10     Pain Loc --      Pain Edu? --      Excl. in GC? --     Constitutional: Alert and oriented.  Dramatic presentation with tears and moaning but easily distracted and calms down appropriately when conversing. Eyes: Conjunctivae are normal. PERRL. EOMI. Head: Atraumatic. Nose: No congestion/rhinnorhea. Mouth/Throat: Mucous membranes are moist.  Oropharynx non-erythematous. Neck: No stridor.   Cardiovascular: Normal rate, regular rhythm. Grossly normal heart sounds.  Good peripheral circulation. Respiratory: Normal respiratory effort.  No retractions. Lungs CTAB. Gastrointestinal: Obese, Soft with mild generalized tenderness, no focal tenderness.  The abdominal palpation was performed while conversing with the patient so she was distracted and I did not appreciate any specific tenderness. No distention.  GU:  Normal external exam.  Small amount of vaginal blood in vault coming from the cervix in the setting of her current menstrual cycle.  Cervix is healthy-appearing with no sign of cervicitis.  Bimanual exam is normal with no cervical motion tenderness.  Slight tenderness to palpation of the right lower quadrant externally but not on internal palpation. Musculoskeletal: No lower extremity tenderness nor edema.  No joint effusions. Neurologic:  Normal speech and language. No gross focal neurologic deficits are appreciated.  Skin:  Skin is warm, dry and intact. No rash noted. Psychiatric: Mood and affect are tearful, somewhat depressed. ____________________________________________   LABS (all labs ordered are listed, but only abnormal results are displayed)  Labs Reviewed  COMPREHENSIVE METABOLIC PANEL - Abnormal; Notable for the following:    Calcium 8.8 (*)    Total Protein 6.2 (*)     All other components within normal limits  URINALYSIS COMPLETEWITH MICROSCOPIC ([facility] ONLY) - Abnormal; Notable for the following:    Color, Urine COLORLESS (*)    APPearance CLEAR (*)    Specific Gravity, Urine 1.003 (*)    Hgb urine dipstick 2+ (*)    Squamous Epithelial / LPF 0-5 (*)    All other components within normal limits  WET PREP, GENITAL  CHLAMYDIA/NGC RT PCR ([facility] ONLY)  LIPASE, BLOOD  CBC  POC URINE PREG, ED  POCT PREGNANCY, URINE   ____________________________________________  EKG  Not indicated ____________________________________________  RADIOLOGY   Not indicated  ____________________________________________   PROCEDURES  Procedure(s) performed: None  Critical Care performed: No ____________________________________________   INITIAL IMPRESSION / ASSESSMENT AND PLAN / ED COURSE  Pertinent labs & imaging results that were available during my care of the patient were reviewed by me and considered in my medical decision making (see chart for details).  The patient's lab work including blood and urine are all reassuring.  She has no focal right upper quadrant tenderness to suggest biliary colic and she has already had an appendectomy.  Her vital signs are stable in spite of her report of severe pain and her crying.  I suspect that she is having acute pain as a result of a hemorrhagic ovarian cyst.  She has had recent ultrasounds for the same reason in this emergency department.  I do not feel that a CT scan is indicated at this time given her benign abdominal exam.  I will perform a pelvic exam to look for signs of infection and to send swabs/cultures.  I will treat her pain with Toradol 30 mg IV, 4 mg of morphine, and 4 mg of Zofran for an anti-emetic, but I also explained that we will not continue doing additional pain medicine at this time.  ----------------------------------------- 5:02 PM on  11/27/2014 -----------------------------------------  The patient's pain is resolved and she is much more comfortable and conversational.  Her pelvic exam was reassuring with no acute abnormal findings other than her menstrual period.  I gave her my usual and customary return precautions and advised that she should [facility] and with a GYN doctor for her persistent chronic pelvic issues that seem related to her menstrual cycle.  She understands and agrees.  I encouraged her to use over-the-counter ibuprofen and Tylenol. ____________________________________________  FINAL CLINICAL IMPRESSION(S) / ED DIAGNOSES  Final diagnoses:  RLQ abdominal pain      NEW MEDICATIONS STARTED DURING THIS VISIT:  New Prescriptions   No medications on file     Loleta Rose, MD 11/27/14 1704

## 2014-11-27 NOTE — ED Notes (Signed)
POCT Preg Neg 

## 2015-04-06 ENCOUNTER — Emergency Department: Payer: Self-pay

## 2015-04-06 ENCOUNTER — Emergency Department
Admission: EM | Admit: 2015-04-06 | Discharge: 2015-04-06 | Disposition: A | Discharge status: Home / Self Care | Payer: Self-pay | Attending: Emergency Medicine | Admitting: Emergency Medicine

## 2015-04-06 DIAGNOSIS — F419 Anxiety disorder, unspecified: Secondary | ICD-10-CM | POA: Insufficient documentation

## 2015-04-06 DIAGNOSIS — R002 Palpitations: Secondary | ICD-10-CM | POA: Insufficient documentation

## 2015-04-06 DIAGNOSIS — R110 Nausea: Secondary | ICD-10-CM | POA: Insufficient documentation

## 2015-04-06 DIAGNOSIS — R079 Chest pain, unspecified: Secondary | ICD-10-CM | POA: Insufficient documentation

## 2015-04-06 DIAGNOSIS — R42 Dizziness and giddiness: Secondary | ICD-10-CM | POA: Insufficient documentation

## 2015-04-06 DIAGNOSIS — Z792 Long term (current) use of antibiotics: Secondary | ICD-10-CM | POA: Insufficient documentation

## 2015-04-06 DIAGNOSIS — Z880 Allergy status to penicillin: Secondary | ICD-10-CM | POA: Insufficient documentation

## 2015-04-06 DIAGNOSIS — F17210 Nicotine dependence, cigarettes, uncomplicated: Secondary | ICD-10-CM | POA: Insufficient documentation

## 2015-04-06 LAB — CBC
HCT: 40.9 % (ref 35.0–47.0)
HEMOGLOBIN: 14.2 g/dL (ref 12.0–16.0)
MCH: 31.5 pg (ref 26.0–34.0)
MCHC: 34.8 g/dL (ref 32.0–36.0)
MCV: 90.4 fL (ref 80.0–100.0)
Platelets: 251 10*3/uL (ref 150–440)
RBC: 4.53 MIL/uL (ref 3.80–5.20)
RDW: 12.4 % (ref 11.5–14.5)
WBC: 9.5 10*3/uL (ref 3.6–11.0)

## 2015-04-06 LAB — BASIC METABOLIC PANEL
ANION GAP: 6 (ref 5–15)
BUN: 10 mg/dL (ref 6–20)
CALCIUM: 9.7 mg/dL (ref 8.9–10.3)
CHLORIDE: 106 mmol/L (ref 101–111)
CO2: 25 mmol/L (ref 22–32)
Creatinine, Ser: 0.68 mg/dL (ref 0.44–1.00)
GFR calc non Af Amer: >60 mL/min (ref >60)
GLUCOSE: 97 mg/dL (ref 65–99)
Potassium: 3.8 mmol/L (ref 3.5–5.1)
Sodium: 137 mmol/L (ref 135–145)

## 2015-04-06 LAB — TROPONIN I: Troponin I: <0.03 ng/mL (ref <0.031)

## 2015-04-06 MED ORDER — OXYCODONE-ACETAMINOPHEN 5-325 MG PO TABS: 1.0000 | ORAL_TABLET | Freq: Four times a day (QID) | ORAL | Status: DC | PRN

## 2015-04-06 MED ORDER — KETOROLAC TROMETHAMINE 30 MG/ML IJ SOLN: 30.0000 mg | Freq: Once | INTRAMUSCULAR | Status: DC

## 2015-04-06 MED ORDER — GI COCKTAIL ~~LOC~~: 30.0000 mL | Freq: Once | ORAL | Status: DC

## 2015-04-06 MED ORDER — LORAZEPAM 1 MG PO TABS: 1.0000 mg | ORAL_TABLET | Freq: Three times a day (TID) | ORAL | Status: AC | PRN

## 2015-04-06 MED ORDER — KETOROLAC TROMETHAMINE 30 MG/ML IJ SOLN: 60.0000 mg | Freq: Once | INTRAMUSCULAR | Status: AC

## 2015-04-06 MED ORDER — LORAZEPAM 1 MG PO TABS
ORAL_TABLET | ORAL | Status: AC
  Administered 2015-04-06: 1 mg via ORAL
  Filled 2015-04-06: qty 1

## 2015-04-06 MED ORDER — LORAZEPAM 1 MG PO TABS
1.0000 mg | ORAL_TABLET | Freq: Once | ORAL | Status: AC
  Administered 2015-04-06: 1 mg via ORAL

## 2015-04-06 MED ORDER — KETOROLAC TROMETHAMINE 60 MG/2ML IM SOLN
INTRAMUSCULAR | Status: AC
  Administered 2015-04-06: 60 mg
  Filled 2015-04-06: qty 2

## 2015-04-06 NOTE — ED Notes (Signed)
Pt c/o "fluttering" to chest and chest pain intermittently X 2 days. SOB and nausea with CP episodes. Pt alert and oriented X4, active, cooperative, pt in NAD. RR even and unlabored, color WNL.

## 2015-04-06 NOTE — Discharge Instructions (Signed)

## 2015-04-06 NOTE — ED Provider Notes (Signed)
Time Seen: Approximately 1820  I have reviewed the triage notes  Chief Complaint: Chest Pain   History of Present Illness: Angel Brock is a 30 y.o. female *who presents with intermittent chest discomfort which started 3 days ago. She describes some intermittent heart palpitations. She's had some nausea and felt lightheaded without any syncopal episode. Patient denies any fever, chills, productive cough or wheezing. She denies any pulmonary emboli risk factors such as travel, history of blood clots, etc. Denies any persistent nature to the chest discomfort. Patient does have a primary physician at [facility].   Past Medical History  Diagnosis Date  . Asthma   . Headache   . Bipolar 1 disorder (HCC)   . Bilateral ovarian cysts     There are no active problems to display for this patient.   Past Surgical History  Procedure Laterality Date  . Tonsillectomy    . Appendectomy    . Tubal ligation      Past Surgical History  Procedure Laterality Date  . Tonsillectomy    . Appendectomy    . Tubal ligation      Current Outpatient Rx  Name  Route  Sig  Dispense  Refill  . acetaminophen (TYLENOL) 325 MG tablet   Oral   Take 650 mg by mouth every 6 (six) hours as needed for mild pain, moderate pain or headache. For pain         . albuterol (PROVENTIL HFA;VENTOLIN HFA) 108 (90 BASE) MCG/ACT inhaler   Inhalation   Inhale 2 puffs into the lungs every 6 (six) hours as needed for wheezing or shortness of breath.         Marland Kitchen ibuprofen (ADVIL,MOTRIN) 200 MG tablet   Oral   Take 200 mg by mouth every 6 (six) hours as needed for mild pain or moderate pain (headaches).          . LORazepam (ATIVAN) 1 MG tablet   Oral   Take 1 tablet (1 mg total) by mouth every 8 (eight) hours as needed for anxiety.   30 tablet   0   . metroNIDAZOLE (FLAGYL) 500 MG tablet   Oral   Take 1 tablet (500 mg total) by mouth 2 (two) times daily.   14 tablet   0   .  oxyCODONE-acetaminophen (ROXICET) 5-325 MG tablet   Oral   Take 1 tablet by mouth every 6 (six) hours as needed.   20 tablet   0   . promethazine (PHENERGAN) 25 MG tablet   Oral   Take 1 tablet (25 mg total) by mouth every 6 (six) hours as needed for nausea or vomiting.   30 tablet   0     Allergies:  2,4-d dimethylamine (amisol); Geodon; Norco; Tramadol; Penicillins; Zofran; and Rocephin  Family History: No family history on file.  Social History: Social History  Substance Use Topics  . Smoking status: Current Every Day Smoker -- 0.50 packs/day    Types: Cigarettes  . Smokeless tobacco: None  . Alcohol Use: No     Comment: occasional     Review of Systems:   10 point review of systems was performed and was otherwise negative:  Constitutional: No fever Eyes: No visual disturbances ENT: No sore throat, ear pain Cardiac: No chest pain Respiratory: No shortness of breath, wheezing, or stridor Abdomen: No abdominal pain, no vomiting, No diarrhea Endocrine: No weight loss, No night sweats Extremities: No peripheral edema, cyanosis Skin: No rashes, easy  bruising Neurologic: No focal weakness, trouble with speech or swollowing Urologic: No dysuria, Hematuria, or urinary frequency   Physical Exam:  ED Triage Vitals  Enc Vitals Group     BP 04/06/15 1653 124/78 mmHg     Pulse Rate 04/06/15 1653 83     Resp 04/06/15 1653 20     Temp 04/06/15 1653 98.1 F (36.7 C)     Temp Source 04/06/15 1653 Oral     SpO2 04/06/15 1653 98 %     Weight 04/06/15 1648 200 lb (90.719 kg)     Height 04/06/15 1648 5\' 1"  (1.549 m)     Head Cir --      Peak Flow --      Pain Score 04/06/15 1648 9     Pain Loc --      Pain Edu? --      Excl. in GC? --     General: Awake , Alert , and Oriented times 3; GCS 15 somewhat anxious Head: Normal cephalic , atraumatic Eyes: Pupils equal , round, reactive to light Nose/Throat: No nasal drainage, patent upper airway without erythema or  exudate.  Neck: Supple, Full range of motion, No anterior adenopathy or palpable thyroid masses Lungs: Clear to ascultation without wheezes , rhonchi, or rales Heart: Regular rate, regular rhythm without murmurs , gallops , or rubs Abdomen: Soft, non tender without rebound, guarding , or rigidity; bowel sounds positive and symmetric in all 4 quadrants. No organomegaly .        Extremities: 2 plus symmetric pulses. No edema, clubbing or cyanosis Neurologic: normal ambulation, Motor symmetric without deficits, sensory intact Skin: warm, dry, no rashes   Labs:   All laboratory work was reviewed including any pertinent negatives or positives listed below:  Labs Reviewed  BASIC METABOLIC PANEL  CBC  TROPONIN I   Review of laboratory work showed no significant findings EKG:   ED ECG REPORT I, Jennye Moccasin, the attending physician, personally viewed and interpreted this ECG.  Date: 04/06/2015 EKG Time: 1649 Rate: 81 Rhythm: normal sinus rhythm QRS Axis: normal Intervals: Incomplete right bundle branch block ST/T Wave abnormalities: normal Conduction Disutrbances: none Narrative Interpretation: unremarkable No ischemic changes   Radiology:    Final result by Rad Results In Interface (04/06/15 17:14:40)   Narrative:   CLINICAL DATA: Left-sided chest pain intermittently for 2 days  EXAM: CHEST 2 VIEW  COMPARISON: 08/19/2014  FINDINGS: The heart size and mediastinal contours are within normal limits. Both lungs are clear. The visualized skeletal structures are unremarkable.  IMPRESSION: No active cardiopulmonary disease.       I personally reviewed the radiologic studies    ED Course:  Differential includes all life-threatening causes for chest pain. This includes but is not exclusive to acute coronary syndrome, aortic dissection, pulmonary embolism, cardiac tamponade, community-acquired pneumonia, pericarditis, musculoskeletal chest wall pain, etc.  patient's stay here was uneventful and she was given IM Toradol along with Ativan and states she had minimal symptomatic relief. This was unlikely to be a life-threatening cause for her chest discomfort at this time. States most likely musculoskeletal or esophageal reflux.   Assessment:  Acute unspecified chest pain Final Clinical Impression:  Final diagnoses:  Chest pain, unspecified chest pain type     Plan:  Outpatient management Patient was advised to return immediately if condition worsens. Patient was advised to follow up with their primary care physician or other specialized physicians involved in their outpatient care  Jennye MoccasinBrian S Quigley, MD 04/06/15 2256

## 2015-12-17 ENCOUNTER — Encounter: Payer: Self-pay | Admitting: *Deleted

## 2015-12-17 ENCOUNTER — Emergency Department
Admission: EM | Admit: 2015-12-17 | Discharge: 2015-12-17 | Disposition: A | Discharge status: Home / Self Care | Payer: Self-pay | Attending: Emergency Medicine | Admitting: Emergency Medicine

## 2015-12-17 DIAGNOSIS — Z79899 Other long term (current) drug therapy: Secondary | ICD-10-CM | POA: Insufficient documentation

## 2015-12-17 DIAGNOSIS — Y999 Unspecified external cause status: Secondary | ICD-10-CM | POA: Insufficient documentation

## 2015-12-17 DIAGNOSIS — Y9389 Activity, other specified: Secondary | ICD-10-CM | POA: Insufficient documentation

## 2015-12-17 DIAGNOSIS — X501XXA Overexertion from prolonged static or awkward postures, initial encounter: Secondary | ICD-10-CM | POA: Insufficient documentation

## 2015-12-17 DIAGNOSIS — F17210 Nicotine dependence, cigarettes, uncomplicated: Secondary | ICD-10-CM | POA: Insufficient documentation

## 2015-12-17 DIAGNOSIS — S8391XA Sprain of unspecified site of right knee, initial encounter: Secondary | ICD-10-CM | POA: Insufficient documentation

## 2015-12-17 DIAGNOSIS — Y929 Unspecified place or not applicable: Secondary | ICD-10-CM | POA: Insufficient documentation

## 2015-12-17 DIAGNOSIS — S86911A Strain of unspecified muscle(s) and tendon(s) at lower leg level, right leg, initial encounter: Secondary | ICD-10-CM

## 2015-12-17 DIAGNOSIS — J45909 Unspecified asthma, uncomplicated: Secondary | ICD-10-CM | POA: Insufficient documentation

## 2015-12-17 MED ORDER — MELOXICAM 15 MG PO TABS: 15.0000 mg | ORAL_TABLET | Freq: Every day | ORAL | 0 refills | Status: DC

## 2015-12-17 MED ORDER — MELOXICAM 7.5 MG PO TABS
15.0000 mg | ORAL_TABLET | Freq: Once | ORAL | Status: AC
  Administered 2015-12-17: 15 mg via ORAL
  Filled 2015-12-17: qty 2

## 2015-12-17 NOTE — ED Triage Notes (Signed)
Pt reports she twisted her right knee today and felt a "pop". Pt reports pain on both sides of right knee and pulling feeling from the upper calf. Pt able to bear weight but reports this increases pain. No swelling noted.

## 2015-12-17 NOTE — ED Provider Notes (Signed)
[facility] Emergency Department Provider Note  ____________________________________________  Time seen: Approximately 6:30 PM  I have reviewed the triage vital signs and the nursing notes.   HISTORY  Chief Complaint Knee Pain    HPI Angel Brock is a 30 y.o. female who presents emergency department complaining of right knee pain. Patient states that she was standing today when she locked her knee and felt a pulling sensation to her knee. Patient states that she has had pain with ambulation but is able to bear weight on the affected knee. Patient states that she did not have any direct trauma, any twisting injury, any falls. She denies any nose or tingling distally. No medications prior to arrival.   Past Medical History:  Diagnosis Date  . Asthma   . Bilateral ovarian cysts   . Bipolar 1 disorder (HCC)   . Headache     There are no active problems to display for this patient.   Past Surgical History:  Procedure Laterality Date  . APPENDECTOMY    . TONSILLECTOMY    . TUBAL LIGATION      Prior to Admission medications   Medication Sig Start Date End Date Taking? Authorizing Provider  acetaminophen (TYLENOL) 325 MG tablet Take 650 mg by mouth every 6 (six) hours as needed for mild pain, moderate pain or headache. For pain    Historical Provider, MD  albuterol (PROVENTIL HFA;VENTOLIN HFA) 108 (90 BASE) MCG/ACT inhaler Inhale 2 puffs into the lungs every 6 (six) hours as needed for wheezing or shortness of breath.    Historical Provider, MD  ibuprofen (ADVIL,MOTRIN) 200 MG tablet Take 200 mg by mouth every 6 (six) hours as needed for mild pain or moderate pain (headaches).     Historical Provider, MD  LORazepam (ATIVAN) 1 MG tablet Take 1 tablet (1 mg total) by mouth every 8 (eight) hours as needed for anxiety. 04/06/15 04/05/16  Jennye MoccasinBrian S Quigley, MD  meloxicam (MOBIC) 15 MG tablet Take 1 tablet (15 mg total) by mouth daily. 12/17/15   Delorise RoyalsJonathan D  Cuthriell, PA-C  metroNIDAZOLE (FLAGYL) 500 MG tablet Take 1 tablet (500 mg total) by mouth 2 (two) times daily. 11/27/14   Loleta Roseory Forbach, MD  oxyCODONE-acetaminophen (ROXICET) 5-325 MG tablet Take 1 tablet by mouth every 6 (six) hours as needed. 04/06/15 04/05/16  Jennye MoccasinBrian S Quigley, MD  promethazine (PHENERGAN) 25 MG tablet Take 1 tablet (25 mg total) by mouth every 6 (six) hours as needed for nausea or vomiting. 09/08/14   Joni Reiningonald K Smith, PA-C    Allergies 2,4-d dimethylamine (amisol); Geodon [ziprasidone hcl]; Norco [hydrocodone-acetaminophen]; Tramadol; Penicillins; Zofran [ondansetron hcl]; and Rocephin [ceftriaxone]  History reviewed. No pertinent family history.  Social History Social History  Substance Use Topics  . Smoking status: Current Every Day Smoker    Packs/day: 0.50    Types: Cigarettes  . Smokeless tobacco: Never Used  . Alcohol use No     Comment: occasional     Review of Systems  Constitutional: No fever/chills Cardiovascular: no chest pain. Respiratory: no cough. No SOB. Musculoskeletal: Positive for right knee pain Skin: Negative for rash, abrasions, lacerations, ecchymosis. Neurological: Negative for headaches, focal weakness or numbness. 10-point ROS otherwise negative.  ____________________________________________   PHYSICAL EXAM:  VITAL SIGNS: ED Triage Vitals  Enc Vitals Group     BP 12/17/15 1815 (!) 101/55     Pulse Rate 12/17/15 1815 68     Resp 12/17/15 1815 16     Temp 12/17/15  1815 97.6 F (36.4 C)     Temp src --      SpO2 12/17/15 1815 99 %     Weight 12/17/15 1817 216 lb (98 kg)     Height 12/17/15 1817 5\' 1"  (1.549 m)     Head Circumference --      Peak Flow --      Pain Score 12/17/15 1817 8     Pain Loc --      Pain Edu? --      Excl. in GC? --      Constitutional: Alert and oriented. Well appearing and in no acute distress. Eyes: Conjunctivae are normal. PERRL. EOMI. Head: Atraumatic. Cardiovascular: Normal rate, regular  rhythm. Normal S1 and S2.  Good peripheral circulation. Respiratory: Normal respiratory effort without tachypnea or retractions. Lungs CTAB. Good air entry to the bases with no decreased or absent breath sounds. Musculoskeletal: Full range of motion to all extremities. No gross deformities appreciated. No gross deformity noted to right knee but inspection. No discernible edema. Full range of motion. Patient is diffusely tender to palpation over the patella and bilateral joint lines. No palpable abnormality. Varus, valgus, Lachman's, McMurray's is negative. Sensation and pulses intact distally. Neurologic:  Normal speech and language. No gross focal neurologic deficits are appreciated.  Skin:  Skin is warm, dry and intact. No rash noted. Psychiatric: Mood and affect are normal. Speech and behavior are normal. Patient exhibits appropriate insight and judgement.   ____________________________________________   LABS (all labs ordered are listed, but only abnormal results are displayed)  Labs Reviewed - No data to display ____________________________________________  EKG   ____________________________________________  RADIOLOGY   No results found.  ____________________________________________    PROCEDURES  Procedure(s) performed:    Procedures    Medications  meloxicam (MOBIC) tablet 15 mg (not administered)     ____________________________________________   INITIAL IMPRESSION / ASSESSMENT AND PLAN / ED COURSE  Pertinent labs & imaging results that were available during my care of the patient were reviewed by me and considered in my medical decision making (see chart for details).  Review of the [place] CSRS was performed in accordance of the [facility] prior to dispensing any controlled drugs.  Clinical Course    Patient's diagnosis is consistent with Left knee sprain. No indication for ligamentous or osseous abnormality. No imaging ordered at this time. Patient is  instructed to use a neoprene knee sleeve and anti-inflammatories for symptomatic control. Patient is given crutches in the emergency department. Patient will be discharged home with prescriptions for meloxicam. Patient is to follow up with orthopedics as needed or otherwise directed. Patient is given ED precautions to return to the ED for any worsening or new symptoms.     ____________________________________________  FINAL CLINICAL IMPRESSION(S) / ED DIAGNOSES  Final diagnoses:  Knee strain, right, initial encounter      NEW MEDICATIONS STARTED DURING THIS VISIT:  New Prescriptions   MELOXICAM (MOBIC) 15 MG TABLET    Take 1 tablet (15 mg total) by mouth daily.        This chart was dictated using voice recognition software/Dragon. Despite best efforts to proofread, errors can occur which can change the meaning. Any change was purely unintentional.    Racheal Patches, PA-C 12/17/15 1846    Phineas Semen, MD 12/17/15 2020

## 2016-03-15 ENCOUNTER — Encounter: Payer: Self-pay | Admitting: Emergency Medicine

## 2016-03-15 ENCOUNTER — Emergency Department
Admission: EM | Admit: 2016-03-15 | Discharge: 2016-03-15 | Disposition: A | Discharge status: Home / Self Care | Payer: Self-pay | Attending: Emergency Medicine | Admitting: Emergency Medicine

## 2016-03-15 DIAGNOSIS — G43901 Migraine, unspecified, not intractable, with status migrainosus: Secondary | ICD-10-CM | POA: Insufficient documentation

## 2016-03-15 DIAGNOSIS — Z791 Long term (current) use of non-steroidal anti-inflammatories (NSAID): Secondary | ICD-10-CM | POA: Insufficient documentation

## 2016-03-15 DIAGNOSIS — F17210 Nicotine dependence, cigarettes, uncomplicated: Secondary | ICD-10-CM | POA: Insufficient documentation

## 2016-03-15 DIAGNOSIS — Z79899 Other long term (current) drug therapy: Secondary | ICD-10-CM | POA: Insufficient documentation

## 2016-03-15 DIAGNOSIS — J45909 Unspecified asthma, uncomplicated: Secondary | ICD-10-CM | POA: Insufficient documentation

## 2016-03-15 MED ORDER — DIPHENHYDRAMINE HCL 50 MG/ML IJ SOLN
25.0000 mg | Freq: Once | INTRAMUSCULAR | Status: AC
  Administered 2016-03-15: 25 mg via INTRAVENOUS
  Filled 2016-03-15: qty 1

## 2016-03-15 MED ORDER — SODIUM CHLORIDE 0.9 % IV BOLUS (SEPSIS)
1000.0000 mL | Freq: Once | INTRAVENOUS | Status: AC
  Administered 2016-03-15: 1000 mL via INTRAVENOUS

## 2016-03-15 MED ORDER — BUTALBITAL-APAP-CAFFEINE 50-325-40 MG PO TABS: 1.0000 | ORAL_TABLET | Freq: Four times a day (QID) | ORAL | 0 refills | Status: AC | PRN

## 2016-03-15 MED ORDER — PROCHLORPERAZINE EDISYLATE 5 MG/ML IJ SOLN
10.0000 mg | Freq: Once | INTRAMUSCULAR | Status: AC
  Administered 2016-03-15: 10 mg via INTRAVENOUS
  Filled 2016-03-15 (×2): qty 2

## 2016-03-15 MED ORDER — KETOROLAC TROMETHAMINE 30 MG/ML IJ SOLN
30.0000 mg | Freq: Once | INTRAMUSCULAR | Status: AC
  Administered 2016-03-15: 30 mg via INTRAVENOUS
  Filled 2016-03-15: qty 1

## 2016-03-15 NOTE — ED Notes (Signed)
Pt c/o headache with some blurred vision and sens to light. Pt A&Ox4, denies any weakness or numbness

## 2016-03-15 NOTE — ED Provider Notes (Signed)
[facility] Emergency Department Provider Note  ____________________________________________   First MD Initiated Contact with Patient 03/15/16 2001     (approximate)  I have reviewed the triage vital signs and the nursing notes.   HISTORY  Chief Complaint Headache   HPI Angel Brock is a 30 y.o. female with a history of migraine headaches was originally 3 days of right-sided headache. She says that the headache started mildly and that has increased in pain ever since 3 days ago. Says her pain is a 10 out of 10 right now feels like a tightness. She describes blurred vision to the right side as well as nausea and vomiting. Says that she has photophobia as well. She has had similar headaches in the past. She has tried aspirin as well as ibuprofen and Aleve at home without relief. Last took at 6:30 PM tonight. The last time she took Aleve was about 3:00 this afternoon. Associated vomiting and nausea yesterday. Said the headache started at work 3 days ago during a particularly stressful day.   Past Medical History:  Diagnosis Date  . Asthma   . Bilateral ovarian cysts   . Bipolar 1 disorder (HCC)   . Headache     There are no active problems to display for this patient.   Past Surgical History:  Procedure Laterality Date  . ANKLE ARTHROSCOPY Left   . APPENDECTOMY    . TONSILLECTOMY    . TUBAL LIGATION      Prior to Admission medications   Medication Sig Start Date End Date Taking? Authorizing Provider  acetaminophen (TYLENOL) 325 MG tablet Take 650 mg by mouth every 6 (six) hours as needed for mild pain, moderate pain or headache. For pain    Historical Provider, MD  albuterol (PROVENTIL HFA;VENTOLIN HFA) 108 (90 BASE) MCG/ACT inhaler Inhale 2 puffs into the lungs every 6 (six) hours as needed for wheezing or shortness of breath.    Historical Provider, MD  ibuprofen (ADVIL,MOTRIN) 200 MG tablet Take 200 mg by mouth every 6 (six) hours as needed  for mild pain or moderate pain (headaches).     Historical Provider, MD  LORazepam (ATIVAN) 1 MG tablet Take 1 tablet (1 mg total) by mouth every 8 (eight) hours as needed for anxiety. 04/06/15 04/05/16  Jennye MoccasinBrian S Quigley, MD  meloxicam (MOBIC) 15 MG tablet Take 1 tablet (15 mg total) by mouth daily. 12/17/15   Delorise RoyalsJonathan D Cuthriell, PA-C  metroNIDAZOLE (FLAGYL) 500 MG tablet Take 1 tablet (500 mg total) by mouth 2 (two) times daily. 11/27/14   Loleta Roseory Forbach, MD  oxyCODONE-acetaminophen (ROXICET) 5-325 MG tablet Take 1 tablet by mouth every 6 (six) hours as needed. 04/06/15 04/05/16  Jennye MoccasinBrian S Quigley, MD  promethazine (PHENERGAN) 25 MG tablet Take 1 tablet (25 mg total) by mouth every 6 (six) hours as needed for nausea or vomiting. 09/08/14   Joni Reiningonald K Smith, PA-C    Allergies 2,4-d dimethylamine (amisol); Geodon [ziprasidone hcl]; Norco [hydrocodone-acetaminophen]; Tramadol; Penicillins; Zofran [ondansetron hcl]; and Rocephin [ceftriaxone]  No family history on file.  Social History Social History  Substance Use Topics  . Smoking status: Current Every Day Smoker    Packs/day: 0.50    Types: Cigarettes  . Smokeless tobacco: Never Used  . Alcohol use No     Comment: occasional    Review of Systems Constitutional: No fever/chills Eyes: No visual changes. ENT: No sore throat. Cardiovascular: Denies chest pain. Respiratory: Denies shortness of breath. Gastrointestinal: No abdominal pain.  No diarrhea.  No constipation. Genitourinary: Negative for dysuria. Musculoskeletal: Negative for back pain. Skin: Negative for rash. Neurological: Negative forfocal weakness or numbness.  10-point ROS otherwise negative.  ____________________________________________   PHYSICAL EXAM:  VITAL SIGNS: ED Triage Vitals  Enc Vitals Group     BP 03/15/16 1957 115/81     Pulse Rate 03/15/16 1955 80     Resp 03/15/16 1955 18     Temp 03/15/16 1955 98.2 F (36.8 C)     Temp Source 03/15/16 1955 Oral     SpO2  03/15/16 1955 98 %     Weight 03/15/16 1956 200 lb (90.7 kg)     Height 03/15/16 1956 5\' 1"  (1.549 m)     Head Circumference --      Peak Flow --      Pain Score 03/15/16 1957 10     Pain Loc --      Pain Edu? --      Excl. in GC? --     Constitutional: Alert and oriented. Well appearing and in no acute distress. Eyes: Conjunctivae are normal. PERRL. EOMI. Head: Atraumatic. Nose: No congestion/rhinnorhea. Mouth/Throat: Mucous membranes are moist.   Neck: No stridor.   Cardiovascular: Normal rate, regular rhythm. Grossly normal heart sounds. Respiratory: Normal respiratory effort.  No retractions. Lungs CTAB. Gastrointestinal: Soft and nontender. No distention.  Musculoskeletal: No lower extremity tenderness nor edema.   Neurologic:  Normal speech and language. No gross focal neurologic deficits are appreciated. No gait instability. Skin:  Skin is warm, dry and intact. No rash noted. Psychiatric: Mood and affect are normal. Speech and behavior are normal.  ____________________________________________   LABS (all labs ordered are listed, but only abnormal results are displayed)  Labs Reviewed - No data to display ____________________________________________  EKG   ____________________________________________  RADIOLOGY   ____________________________________________   PROCEDURES  Procedure(s) performed:   Procedures  Critical Care performed:   ____________________________________________   INITIAL IMPRESSION / ASSESSMENT AND PLAN / ED COURSE  Pertinent labs & imaging results that were available during my care of the patient were reviewed by me and considered in my medical decision making (see chart for details).   Clinical Course   ----------------------------------------- 9:03 PM on 03/15/2016 -----------------------------------------  Patient resting a little bit this time. Says the headache is now 7 out of 10. Says that she feels the headache is  breaking and says that she feels comfortable to go home. She says that she is able to have some drive her arms and she was given Benadryl. She will be given Fioricet for home use. She says that she has used this medication in the past with great success. She is understanding of the plan and willing to comply. Will be discharged home. [facility].   ____________________________________________   FINAL CLINICAL IMPRESSION(S) / ED DIAGNOSES  Migraine headache.    NEW MEDICATIONS STARTED DURING THIS VISIT:  New Prescriptions   No medications on file     Note:  This document was prepared using Dragon voice recognition software and may include unintentional dictation errors.    Myrna Blazeravid Matthew Tasia Liz, MD 03/15/16 2103

## 2016-03-15 NOTE — ED Triage Notes (Signed)
Pt is ambulatory to triage with c/o headache x3 days. Pt states that she has been experienced nausea and vomiting with no vomiting today but x2 last night. Pt is in NAD at this time.

## 2016-03-22 ENCOUNTER — Encounter: Payer: Self-pay | Admitting: Emergency Medicine

## 2016-03-22 ENCOUNTER — Emergency Department
Admission: EM | Admit: 2016-03-22 | Discharge: 2016-03-22 | Disposition: A | Discharge status: Home / Self Care | Payer: Self-pay | Attending: Emergency Medicine | Admitting: Emergency Medicine

## 2016-03-22 ENCOUNTER — Emergency Department: Payer: Self-pay

## 2016-03-22 DIAGNOSIS — F17210 Nicotine dependence, cigarettes, uncomplicated: Secondary | ICD-10-CM | POA: Insufficient documentation

## 2016-03-22 DIAGNOSIS — J45909 Unspecified asthma, uncomplicated: Secondary | ICD-10-CM | POA: Insufficient documentation

## 2016-03-22 DIAGNOSIS — M549 Dorsalgia, unspecified: Secondary | ICD-10-CM

## 2016-03-22 DIAGNOSIS — M546 Pain in thoracic spine: Secondary | ICD-10-CM | POA: Insufficient documentation

## 2016-03-22 DIAGNOSIS — Z79899 Other long term (current) drug therapy: Secondary | ICD-10-CM | POA: Insufficient documentation

## 2016-03-22 LAB — URINALYSIS, ROUTINE W REFLEX MICROSCOPIC
BILIRUBIN URINE: NEGATIVE
Glucose, UA: NEGATIVE mg/dL
HGB URINE DIPSTICK: NEGATIVE
Ketones, ur: NEGATIVE mg/dL
Leukocytes, UA: NEGATIVE
Nitrite: NEGATIVE
PH: 6 (ref 5.0–8.0)
Protein, ur: NEGATIVE mg/dL
SPECIFIC GRAVITY, URINE: 1.024 (ref 1.005–1.030)

## 2016-03-22 LAB — POCT PREGNANCY, URINE: Preg Test, Ur: NEGATIVE

## 2016-03-22 MED ORDER — METHOCARBAMOL 500 MG PO TABS: 500.0000 mg | ORAL_TABLET | Freq: Four times a day (QID) | ORAL | 0 refills | Status: DC

## 2016-03-22 MED ORDER — KETOROLAC TROMETHAMINE 30 MG/ML IJ SOLN
30.0000 mg | Freq: Once | INTRAMUSCULAR | Status: AC
  Administered 2016-03-22: 30 mg via INTRAMUSCULAR
  Filled 2016-03-22: qty 1

## 2016-03-22 MED ORDER — NAPROXEN 500 MG PO TABS: 500.0000 mg | ORAL_TABLET | Freq: Two times a day (BID) | ORAL | 0 refills | Status: DC

## 2016-03-22 NOTE — ED Provider Notes (Signed)
[facility] Emergency Department Provider Note   ____________________________________________   First MD Initiated Contact with Patient 03/22/16 1031     (approximate)  I have reviewed the triage vital signs and the nursing notes.   HISTORY  Chief Complaint Back Pain    HPI Angel Brock is a 30 y.o. female is here with complaint of back pain.Patient states that she developed mid back pain this morning and that pain increases with movement. She denies any fever or chills. There's been no nausea, vomiting or urinary symptoms. She denies any previous kidney stones. Patient has not taken any over-the-counter medication prior to arrival in the emergency room. She denies any paresthesias into her lower extremities and she still continues to ambulate without assistance. Currently she rates her pain as a 3/10.   Past Medical History:  Diagnosis Date  . Asthma   . Bilateral ovarian cysts   . Bipolar 1 disorder (HCC)   . Headache     There are no active problems to display for this patient.   Past Surgical History:  Procedure Laterality Date  . ANKLE ARTHROSCOPY Left   . APPENDECTOMY    . TONSILLECTOMY    . TUBAL LIGATION      Prior to Admission medications   Medication Sig Start Date End Date Taking? Authorizing Provider  acetaminophen (TYLENOL) 325 MG tablet Take 650 mg by mouth every 6 (six) hours as needed for mild pain, moderate pain or headache. For pain    Historical Provider, MD  albuterol (PROVENTIL HFA;VENTOLIN HFA) 108 (90 BASE) MCG/ACT inhaler Inhale 2 puffs into the lungs every 6 (six) hours as needed for wheezing or shortness of breath.    Historical Provider, MD  butalbital-acetaminophen-caffeine (FIORICET, ESGIC) [phone] [id number] MG tablet Take 1-2 tablets by mouth every 6 (six) hours as needed for headache. 03/15/16 03/15/17  Myrna Blazeravid Matthew Schaevitz, MD  ibuprofen (ADVIL,MOTRIN) 200 MG tablet Take 200 mg by mouth every 6 (six) hours as  needed for mild pain or moderate pain (headaches).     Historical Provider, MD  LORazepam (ATIVAN) 1 MG tablet Take 1 tablet (1 mg total) by mouth every 8 (eight) hours as needed for anxiety. 04/06/15 04/05/16  Jennye MoccasinBrian S Quigley, MD  methocarbamol (ROBAXIN) 500 MG tablet Take 1 tablet (500 mg total) by mouth 4 (four) times daily. 03/22/16   Tommi Rumpshonda L Brayan Votaw, PA-C  naproxen (NAPROSYN) 500 MG tablet Take 1 tablet (500 mg total) by mouth 2 (two) times daily with a meal. 03/22/16   Tommi Rumpshonda L Gurtaj Ruz, PA-C    Allergies 2,4-d dimethylamine (amisol); Geodon [ziprasidone hcl]; Norco [hydrocodone-acetaminophen]; Tramadol; Penicillins; Zofran [ondansetron hcl]; and Rocephin [ceftriaxone]  No family history on file.  Social History Social History  Substance Use Topics  . Smoking status: Current Every Day Smoker    Packs/day: 0.50    Types: Cigarettes  . Smokeless tobacco: Never Used  . Alcohol use No     Comment: occasional    Review of Systems Constitutional: No fever/chills Cardiovascular: Denies chest pain. Respiratory: Denies shortness of breath. Gastrointestinal: No abdominal pain.  No nausea, no vomiting.  No diarrhea.  No constipation. Genitourinary: Negative for dysuria. Musculoskeletal: Positive for back pain. Skin: Negative for rash. Neurological: Negative for headaches, focal weakness or numbness.  10-point ROS otherwise negative.  ____________________________________________   PHYSICAL EXAM:  VITAL SIGNS: ED Triage Vitals  Enc Vitals Group     BP 03/22/16 1018 115/78     Pulse Rate 03/22/16 1018 66  Resp 03/22/16 1018 20     Temp 03/22/16 1018 97.9 F (36.6 C)     Temp Source 03/22/16 1018 Oral     SpO2 03/22/16 1018 98 %     Weight 03/22/16 1015 200 lb (90.7 kg)     Height 03/22/16 1015 5\' 1"  (1.549 m)     Head Circumference --      Peak Flow --      Pain Score 03/22/16 1029 7     Pain Loc --      Pain Edu? --      Excl. in GC? --     Constitutional: Alert  and oriented. Well appearing and in no acute distress. Eyes: Conjunctivae are normal. PERRL. EOMI. Head: Atraumatic. Nose: No congestion/rhinnorhea. Neck: No stridor.   Cardiovascular: Normal rate, regular rhythm. Grossly normal heart sounds.  Good peripheral circulation. Respiratory: Normal respiratory effort.  No retractions. Lungs CTAB. Gastrointestinal: Soft and nontender. No distention. No CVA tenderness. Musculoskeletal: On examination of the back there is no gross deformity noted. There is no active muscle spasm seen with range of motion. There is some minimal tenderness on palpation of the upper lumbar spine. Straight leg raises were negative. Reflexes are 2+ bilaterally. Patient gait was normal. Neurologic:  Normal speech and language. No gross focal neurologic deficits are appreciated. No gait instability. Skin:  Skin is warm, dry and intact. No rash noted. Psychiatric: Mood and affect are normal. Speech and behavior are normal.  ____________________________________________   LABS (all labs ordered are listed, but only abnormal results are displayed)  Labs Reviewed  URINALYSIS, ROUTINE W REFLEX MICROSCOPIC - Abnormal; Notable for the following:       Result Value   Color, Urine YELLOW (*)    APPearance CLEAR (*)    All other components within normal limits  POC URINE PREG, ED  POCT PREGNANCY, URINE    RADIOLOGY  Lumbar spine x-ray per radiologist is negative. I, Tommi Rumps, personally viewed and evaluated these images (plain radiographs) as part of my medical decision making, as well as reviewing the written report by the radiologist. ____________________________________________   PROCEDURES  Procedure(s) performed: None  Procedures  Critical Care performed: No  ____________________________________________   INITIAL IMPRESSION / ASSESSMENT AND PLAN / ED COURSE  Pertinent labs & imaging results that were available during my care of the patient were  reviewed by me and considered in my medical decision making (see chart for details).    Clinical Course    Patient had some relief of her back pain with Toradol 30 mg IM prior to having x-rays. Urinalysis was negative. Patient was given a prescription for Robaxin 500 mg 4 times a day and naproxen 500 mg twice a day with food. She is encouraged to use moist heat or ice to her back as needed. She will follow-up with [facility] clinic or her primary care doctor if any continued problems.  ____________________________________________   FINAL CLINICAL IMPRESSION(S) / ED DIAGNOSES  Final diagnoses:  Mid-back pain, acute      NEW MEDICATIONS STARTED DURING THIS VISIT:  Discharge Medication List as of 03/22/2016 12:38 PM    START taking these medications   Details  methocarbamol (ROBAXIN) 500 MG tablet Take 1 tablet (500 mg total) by mouth 4 (four) times daily., Starting Fri 03/22/2016, Print    naproxen (NAPROSYN) 500 MG tablet Take 1 tablet (500 mg total) by mouth 2 (two) times daily with a meal., Starting Fri 03/22/2016, Print  Note:  This document was prepared using Dragon voice recognition software and may include unintentional dictation errors.    Tommi RumpsRhonda L Syrita Dovel, PA-C 03/22/16 1544    Jennye MoccasinBrian S Quigley, MD 03/23/16 [id number]

## 2016-03-22 NOTE — Discharge Instructions (Signed)
Begin taking medication as directed. Naproxen 500 mg twice a day with food. Also Robaxin 500 mg 4 times a day as needed for muscle spasms. He may use ice or heat to your muscles as needed. He'll need to follow-up with your primary care doctor or [facility] if any continued problems.

## 2016-03-22 NOTE — ED Triage Notes (Signed)
States she developed pain to mid back this am   States pain moved to mid abd  Denies any fever or n/v or urinary sx'

## 2016-04-07 ENCOUNTER — Emergency Department
Admission: EM | Admit: 2016-04-07 | Discharge: 2016-04-07 | Disposition: A | Discharge status: Home / Self Care | Payer: Self-pay | Attending: Emergency Medicine | Admitting: Emergency Medicine

## 2016-04-07 DIAGNOSIS — Z23 Encounter for immunization: Secondary | ICD-10-CM | POA: Insufficient documentation

## 2016-04-07 DIAGNOSIS — Y990 Civilian activity done for income or pay: Secondary | ICD-10-CM | POA: Insufficient documentation

## 2016-04-07 DIAGNOSIS — W260XXA Contact with knife, initial encounter: Secondary | ICD-10-CM | POA: Insufficient documentation

## 2016-04-07 DIAGNOSIS — J45909 Unspecified asthma, uncomplicated: Secondary | ICD-10-CM | POA: Insufficient documentation

## 2016-04-07 DIAGNOSIS — Y929 Unspecified place or not applicable: Secondary | ICD-10-CM | POA: Insufficient documentation

## 2016-04-07 DIAGNOSIS — F17210 Nicotine dependence, cigarettes, uncomplicated: Secondary | ICD-10-CM | POA: Insufficient documentation

## 2016-04-07 DIAGNOSIS — Y9389 Activity, other specified: Secondary | ICD-10-CM | POA: Insufficient documentation

## 2016-04-07 DIAGNOSIS — S61211A Laceration without foreign body of left index finger without damage to nail, initial encounter: Secondary | ICD-10-CM | POA: Insufficient documentation

## 2016-04-07 DIAGNOSIS — Z79899 Other long term (current) drug therapy: Secondary | ICD-10-CM | POA: Insufficient documentation

## 2016-04-07 MED ORDER — TETANUS-DIPHTH-ACELL PERTUSSIS 5-2.5-18.5 LF-MCG/0.5 IM SUSP
0.5000 mL | Freq: Once | INTRAMUSCULAR | Status: AC
  Administered 2016-04-07: 0.5 mL via INTRAMUSCULAR
  Filled 2016-04-07: qty 0.5

## 2016-04-07 MED ORDER — LIDOCAINE-EPINEPHRINE-TETRACAINE (LET) SOLUTION
3.0000 mL | Freq: Once | NASAL | Status: AC
  Administered 2016-04-07: 3 mL via TOPICAL
  Filled 2016-04-07: qty 3

## 2016-04-07 NOTE — ED Provider Notes (Signed)
[facility] Emergency Department Provider Note ____________________________________________  Time seen: 1345  I have reviewed the triage vital signs and the nursing notes.  HISTORY  Chief Complaint  Extremity Laceration  HPI Angel Brock is a 31 y.o. female presents to the ED from work, for evaluation treatment of a laceration to the left index finger. 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 She presents now with bleeding controlled and he started to have a small superficial laceration to the distal phalanx. Patient reports a tetanus status is not current this time. She denies any other injury at this time.  Past Medical History:  Diagnosis Date  . Asthma   . Bilateral ovarian cysts   . Bipolar 1 disorder (HCC)   . Headache     There are no active problems to display for this patient.   Past Surgical History:  Procedure Laterality Date  . ANKLE ARTHROSCOPY Left   . APPENDECTOMY    . TONSILLECTOMY    . TUBAL LIGATION      Prior to Admission medications   Medication Sig Start Date End Date Taking? Authorizing Provider  acetaminophen (TYLENOL) 325 MG tablet Take 650 mg by mouth every 6 (six) hours as needed for mild pain, moderate pain or headache. For pain    Historical Provider, MD  albuterol (PROVENTIL HFA;VENTOLIN HFA) 108 (90 BASE) MCG/ACT inhaler Inhale 2 puffs into the lungs every 6 (six) hours as needed for wheezing or shortness of breath.    Historical Provider, MD  butalbital-acetaminophen-caffeine (FIORICET, ESGIC) [phone] MG tablet Take 1-2 tablets by mouth every 6 (six) hours as needed for headache. 03/15/16 03/15/17  [name], MD  ibuprofen (ADVIL,MOTRIN) 200 MG tablet Take 200 mg by mouth every 6 (six) hours as needed for mild pain or moderate pain (headaches).     Historical Provider, MD  methocarbamol (ROBAXIN) 500 MG tablet Take 1 tablet (500 mg  total) by mouth 4 (four) times daily. 03/22/16   Tommi Rumps, PA-C  naproxen (NAPROSYN) 500 MG tablet Take 1 tablet (500 mg total) by mouth 2 (two) times daily with a meal. 03/22/16   Tommi Rumps, PA-C   Allergies 2,4-d dimethylamine (amisol); Geodon [ziprasidone hcl]; Norco [hydrocodone-acetaminophen]; Tramadol; Penicillins; Zofran [ondansetron hcl]; and Rocephin [ceftriaxone]  No family history on file.  Social History Social History  Substance Use Topics  . Smoking status: Current Every Day Smoker    Packs/day: 0.50    Types: Cigarettes  . Smokeless tobacco: Never Used  . Alcohol use No     Comment: occasional    Review of Systems  Constitutional: Negative for fever. Skin: Negative for rash. Left index finger lac as above.  Neurological: Negative for headaches, focal weakness or numbness. ____________________________________________  PHYSICAL EXAM:  VITAL SIGNS: ED Triage Vitals [04/07/16 1250]  Enc Vitals Group     BP 123/72     Pulse Rate 82     Resp 16     Temp 98.1 F (36.7 C)     Temp Source Oral     SpO2 100 %     Weight 200 lb (90.7 kg)     Height 5\' 1"  (1.549 m)     Head Circumference      Peak Flow      Pain Score 10     Pain Loc      Pain Edu?      Excl. in GC?  Constitutional: Alert and oriented. Well appearing and in no distress. Head: Normocephalic and atraumatic. Cardiovascular:  Normal distal pulses. Respiratory: Normal respiratory effort.  Musculoskeletal: Normal composite fist on the left. Nontender with normal range of motion in all extremities.  Neurologic:  Normal gross sensation. Normal intrinsic & opposition testing. Normal speech and language. No gross focal neurologic deficits are appreciated. Skin:  Skin is warm, dry and intact. No rash noted. Left index with a superficial laceration to the distal phalanx on the radial side.  Psychiatric: Mood and affect are normal. Patient exhibits appropriate insight and  judgment. ____________________________________________  PROCEDURES  Tdap 0.5 ml IM  LACERATION REPAIR Performed by: Lissa HoardMenshew, Kairy Folsom V Bacon Authorized by: Lissa HoardMenshew, Terina Mcelhinny V Bacon Consent: Verbal consent obtained. Risks and benefits: risks, benefits and alternatives were discussed Consent given by: patient Patient identity confirmed: provided demographic data Prepped and Draped in normal sterile fashion Wound explored  Laceration Location: left index finger  Laceration Length: 1 cm  No Foreign Bodies seen or palpated  Anesthesia: topical infiltration  Local anesthetic: lidocaine-epinephrine-tetracaine  Anesthetic total: 3 ml  Irrigation method: syringe Amount of cleaning: standard  Skin closure: Dermabond  Patient tolerance: Patient tolerated the procedure well with no immediate complications. ____________________________________________  INITIAL IMPRESSION / ASSESSMENT AND PLAN / ED COURSE  Patient with a superficial left index finger laceration status post wound adhesive repair. Patient is discharged after tetanus booster was administered. She will follow-up with her primary care provider as needed. She should return to work as scheduled keep wound clean, covered, and dry.  Clinical Course    ____________________________________________  FINAL CLINICAL IMPRESSION(S) / ED DIAGNOSES  Final diagnoses:  Laceration of left index finger without foreign body without damage to nail, initial encounter      Lissa HoardJenise V Bacon Marleni Gallardo, PA-C 04/07/16 1529    Sharman CheekPhillip Stafford, MD 04/07/16 2028

## 2016-04-07 NOTE — Discharge Instructions (Signed)
Your wound has been repaired with wound adhesive. Keep the wound clean, dry, and covered as needed. Avoid using creams, ointments, or lotions over the glue. Follow-up with your provider for continued symptoms.

## 2016-04-07 NOTE — ED Notes (Signed)
Pt states she does not wish to file this visit as workers comp, Charity fundraiserN and PA notified.

## 2016-04-07 NOTE — ED Notes (Signed)
See triage note  States she was using a knife this am  Laceration noted to left index finger

## 2016-04-07 NOTE — ED Triage Notes (Addendum)
Laceration to left index finger while at work. Wrapped upon arrival. Bleeding controlled.  Workers [place] [employer]	[place] and with patient labels.

## 2016-04-30 ENCOUNTER — Encounter: Payer: Self-pay | Admitting: *Deleted

## 2016-04-30 DIAGNOSIS — J45909 Unspecified asthma, uncomplicated: Secondary | ICD-10-CM | POA: Insufficient documentation

## 2016-04-30 DIAGNOSIS — R05 Cough: Secondary | ICD-10-CM | POA: Insufficient documentation

## 2016-04-30 DIAGNOSIS — F17210 Nicotine dependence, cigarettes, uncomplicated: Secondary | ICD-10-CM | POA: Insufficient documentation

## 2016-04-30 DIAGNOSIS — R197 Diarrhea, unspecified: Secondary | ICD-10-CM | POA: Insufficient documentation

## 2016-04-30 DIAGNOSIS — Z791 Long term (current) use of non-steroidal anti-inflammatories (NSAID): Secondary | ICD-10-CM | POA: Insufficient documentation

## 2016-04-30 DIAGNOSIS — R51 Headache: Secondary | ICD-10-CM | POA: Insufficient documentation

## 2016-04-30 DIAGNOSIS — Z79899 Other long term (current) drug therapy: Secondary | ICD-10-CM | POA: Insufficient documentation

## 2016-04-30 NOTE — ED Triage Notes (Signed)
Pt reports cough, runny nose, bodyaches and chills for 2 today.  Pt has bil earache.

## 2016-05-01 ENCOUNTER — Emergency Department
Admission: EM | Admit: 2016-05-01 | Discharge: 2016-05-01 | Disposition: A | Discharge status: Home / Self Care | Payer: Self-pay | Attending: Emergency Medicine | Admitting: Emergency Medicine

## 2016-05-01 ENCOUNTER — Emergency Department: Payer: Self-pay

## 2016-05-01 DIAGNOSIS — R69 Illness, unspecified: Secondary | ICD-10-CM

## 2016-05-01 DIAGNOSIS — R059 Cough, unspecified: Secondary | ICD-10-CM

## 2016-05-01 DIAGNOSIS — R05 Cough: Secondary | ICD-10-CM

## 2016-05-01 DIAGNOSIS — J111 Influenza due to unidentified influenza virus with other respiratory manifestations: Secondary | ICD-10-CM

## 2016-05-01 MED ORDER — OSELTAMIVIR PHOSPHATE 75 MG PO CAPS
75.0000 mg | ORAL_CAPSULE | Freq: Once | ORAL | Status: AC
  Administered 2016-05-01: 75 mg via ORAL
  Filled 2016-05-01: qty 1

## 2016-05-01 MED ORDER — BENZONATATE 100 MG PO CAPS
100.0000 mg | ORAL_CAPSULE | Freq: Four times a day (QID) | ORAL | 0 refills | Status: DC | PRN
Start: 2016-05-01 — End: 2016-11-07

## 2016-05-01 MED ORDER — OSELTAMIVIR PHOSPHATE 75 MG PO CAPS: 75.0000 mg | ORAL_CAPSULE | Freq: Two times a day (BID) | ORAL | 0 refills | Status: AC

## 2016-05-01 MED ORDER — BENZONATATE 100 MG PO CAPS
100.0000 mg | ORAL_CAPSULE | Freq: Once | ORAL | Status: AC
  Administered 2016-05-01: 100 mg via ORAL
  Filled 2016-05-01: qty 1

## 2016-05-01 NOTE — ED Provider Notes (Signed)
[facility] Emergency Department Provider Note   ____________________________________________   First MD Initiated Contact with Patient 05/01/16 [id number]     (approximate)  I have reviewed the triage vital signs and the nursing notes.   HISTORY  Chief Complaint Cough    HPI Angel Brock is a 31 y.o. female who comes into the hospital today feeling under the weather. She reports that her manager was diagnosed with the flu and then she came down with the symptoms of body aches chills headache diarrhea. She reports that the symptoms she had yesterday but it got worse today. She denies any fevers at home but she is been in bed all day. She's been taking Mucinex and Chloraseptic lozenges as well as Tylenol for headache. The patient rates her pain a 5 out of 10 in intensity currently. She is achy with a cough and some mild shortness of breath. She has asthma and has been using her inhaler more than normal. She came in today for evaluation.   Past Medical History:  Diagnosis Date  . Asthma   . Bilateral ovarian cysts   . Bipolar 1 disorder (HCC)   . Headache     There are no active problems to display for this patient.   Past Surgical History:  Procedure Laterality Date  . ANKLE ARTHROSCOPY Left   . APPENDECTOMY    . TONSILLECTOMY    . TUBAL LIGATION      Prior to Admission medications   Medication Sig Start Date End Date Taking? Authorizing Provider  acetaminophen (TYLENOL) 325 MG tablet Take 650 mg by mouth every 6 (six) hours as needed for mild pain, moderate pain or headache. For pain    Historical Provider, MD  albuterol (PROVENTIL HFA;VENTOLIN HFA) 108 (90 BASE) MCG/ACT inhaler Inhale 2 puffs into the lungs every 6 (six) hours as needed for wheezing or shortness of breath.    Historical Provider, MD  benzonatate (TESSALON PERLES) 100 MG capsule Take 1 capsule (100 mg total) by mouth every 6 (six) hours as needed for cough. 05/01/16   Rebecka ApleyAllison P  Rollen Selders, MD  butalbital-acetaminophen-caffeine (FIORICET, [facility]) [phone] MG tablet Take 1-2 tablets by mouth every 6 (six) hours as needed for headache. 03/15/16 03/15/17  Myrna Blazeravid Matthew Schaevitz, MD  ibuprofen (ADVIL,MOTRIN) 200 MG tablet Take 200 mg by mouth every 6 (six) hours as needed for mild pain or moderate pain (headaches).     Historical Provider, MD  methocarbamol (ROBAXIN) 500 MG tablet Take 1 tablet (500 mg total) by mouth 4 (four) times daily. 03/22/16   Tommi Rumpshonda L Summers, PA-C  naproxen (NAPROSYN) 500 MG tablet Take 1 tablet (500 mg total) by mouth 2 (two) times daily with a meal. 03/22/16   Tommi Rumpshonda L Summers, PA-C  oseltamivir (TAMIFLU) 75 MG capsule Take 1 capsule (75 mg total) by mouth 2 (two) times daily. 05/01/16 05/06/16  Rebecka ApleyAllison P Eather Chaires, MD    Allergies 2,4-d dimethylamine (amisol); Geodon [ziprasidone hcl]; Norco [hydrocodone-acetaminophen]; Tramadol; Penicillins; Zofran [ondansetron hcl]; and Rocephin [ceftriaxone]  No family history on file.  Social History Social History  Substance Use Topics  . Smoking status: Current Every Day Smoker    Packs/day: 0.50    Types: Cigarettes  . Smokeless tobacco: Never Used  . Alcohol use No     Comment: occasional    Review of Systems Constitutional: No fever/chills Eyes: No visual changes. ENT:  sore throat. Cardiovascular: Denies chest pain. Respiratory: Cough and shortness of breath. Gastrointestinal: Nausea with No  abdominal pain.   no vomiting.  No diarrhea.  No constipation. Genitourinary: Negative for dysuria. Musculoskeletal: Body aches Skin: Negative for rash. Neurological: Headache  10-point ROS otherwise negative.  ____________________________________________   PHYSICAL EXAM:  VITAL SIGNS: ED Triage Vitals  Enc Vitals Group     BP 04/30/16 2323 132/88     Pulse Rate 04/30/16 2323 78     Resp 04/30/16 2323 (!) 1     Temp 04/30/16 2323 98.2 F (36.8 C)     Temp Source 04/30/16 2323 Oral     SpO2  04/30/16 2323 100 %     Weight 04/30/16 2324 200 lb (90.7 kg)     Height 04/30/16 2324 5\' 1"  (1.549 m)     Head Circumference --      Peak Flow --      Pain Score 04/30/16 2324 6     Pain Loc --      Pain Edu? --      Excl. in GC? --     Constitutional: Alert and oriented. Well appearing and in mild distress. Eyes: Conjunctivae are normal. PERRL. EOMI. Head: Atraumatic. Nose: No congestion/rhinnorhea. Mouth/Throat: Mucous membranes are moist.  Oropharynx non-erythematous. Cardiovascular: Normal rate, regular rhythm. Grossly normal heart sounds.  Good peripheral circulation. Respiratory: Normal respiratory effort.  No retractions. Expiratory wheezes in all lung fields. Gastrointestinal: Soft and nontender. No distention. Positive bowel sounds Musculoskeletal: No lower extremity tenderness nor edema.   Neurologic:  Normal speech and language.  Skin:  Skin is warm, dry and intact.  Psychiatric: Mood and affect are normal.   ____________________________________________   LABS (all labs ordered are listed, but only abnormal results are displayed)  Labs Reviewed - No data to display ____________________________________________  EKG  none ____________________________________________  RADIOLOGY  CXR ____________________________________________   PROCEDURES  Procedure(s) performed: None  Procedures  Critical Care performed: No  ____________________________________________   INITIAL IMPRESSION / ASSESSMENT AND PLAN / ED COURSE  Pertinent labs & imaging results that were available during my care of the patient were reviewed by me and considered in my medical decision making (see chart for details).  This is a 31 year old female who comes into the hospital today with some flulike symptoms. Patient's manager has also had a diagnosis of the flu. I did perform a chest x-ray which was negative. The patient has no wheezing and looks well. I will give the patient a dose of  Tamiflu and discharge her with some cough medicine and some Tamiflu. Also give the patient a note for work. The patient will be discharged home.  Clinical Course as of May 01 901  Wed May 01, 2016  0141 No active cardiopulmonary disease. DG Chest 2 View [AW]    Clinical Course User Index [AW] Rebecka Apley, MD     ____________________________________________   FINAL CLINICAL IMPRESSION(S) / ED DIAGNOSES  Final diagnoses:  Cough  Influenza-like illness      NEW MEDICATIONS STARTED DURING THIS VISIT:  Discharge Medication List as of 05/01/2016  1:45 AM    START taking these medications   Details  benzonatate (TESSALON PERLES) 100 MG capsule Take 1 capsule (100 mg total) by mouth every 6 (six) hours as needed for cough., Starting Wed 05/01/2016, Print    oseltamivir (TAMIFLU) 75 MG capsule Take 1 capsule (75 mg total) by mouth 2 (two) times daily., Starting Wed 05/01/2016, Until Mon 05/06/2016, Print         Note:  This document was prepared using Dragon  voice recognition software and may include unintentional dictation errors.    Rebecka Apley, MD 05/01/16 [phone]

## 2016-05-01 NOTE — ED Notes (Signed)

## 2016-06-27 ENCOUNTER — Emergency Department
Admission: EM | Admit: 2016-06-27 | Discharge: 2016-06-27 | Disposition: A | Discharge status: Home / Self Care | Payer: Self-pay | Attending: Emergency Medicine | Admitting: Emergency Medicine

## 2016-06-27 ENCOUNTER — Encounter: Payer: Self-pay | Admitting: *Deleted

## 2016-06-27 DIAGNOSIS — R251 Tremor, unspecified: Secondary | ICD-10-CM | POA: Insufficient documentation

## 2016-06-27 DIAGNOSIS — R42 Dizziness and giddiness: Secondary | ICD-10-CM | POA: Insufficient documentation

## 2016-06-27 DIAGNOSIS — J45909 Unspecified asthma, uncomplicated: Secondary | ICD-10-CM | POA: Insufficient documentation

## 2016-06-27 DIAGNOSIS — F17210 Nicotine dependence, cigarettes, uncomplicated: Secondary | ICD-10-CM | POA: Insufficient documentation

## 2016-06-27 LAB — BASIC METABOLIC PANEL
Anion gap: 8 (ref 5–15)
BUN: 13 mg/dL (ref 6–20)
CHLORIDE: 106 mmol/L (ref 101–111)
CO2: 23 mmol/L (ref 22–32)
Calcium: 9 mg/dL (ref 8.9–10.3)
Creatinine, Ser: 0.81 mg/dL (ref 0.44–1.00)
GFR calc non Af Amer: >60 mL/min (ref >60)
Glucose, Bld: 107 mg/dL — ABNORMAL HIGH (ref 65–99)
Potassium: 3.1 mmol/L — ABNORMAL LOW (ref 3.5–5.1)
SODIUM: 137 mmol/L (ref 135–145)

## 2016-06-27 LAB — URINALYSIS, COMPLETE (UACMP) WITH MICROSCOPIC
BACTERIA UA: NONE SEEN
BILIRUBIN URINE: NEGATIVE
Glucose, UA: NEGATIVE mg/dL
Hgb urine dipstick: NEGATIVE
KETONES UR: NEGATIVE mg/dL
LEUKOCYTES UA: NEGATIVE
Nitrite: NEGATIVE
Protein, ur: NEGATIVE mg/dL
Specific Gravity, Urine: 1.01 (ref 1.005–1.030)
pH: 5 (ref 5.0–8.0)

## 2016-06-27 LAB — CBC
HEMATOCRIT: 39.9 % (ref 35.0–47.0)
Hemoglobin: 14.1 g/dL (ref 12.0–16.0)
MCH: 33 pg (ref 26.0–34.0)
MCHC: 35.4 g/dL (ref 32.0–36.0)
MCV: 93.3 fL (ref 80.0–100.0)
Platelets: 193 10*3/uL (ref 150–440)
RBC: 4.27 MIL/uL (ref 3.80–5.20)
RDW: 12.7 % (ref 11.5–14.5)
WBC: 7.2 10*3/uL (ref 3.6–11.0)

## 2016-06-27 LAB — POCT PREGNANCY, URINE
PREG TEST UR: NEGATIVE
PREG TEST UR: NEGATIVE

## 2016-06-27 MED ORDER — DIAZEPAM 5 MG PO TABS
10.0000 mg | ORAL_TABLET | Freq: Once | ORAL | Status: AC
  Administered 2016-06-27: 10 mg via ORAL
  Filled 2016-06-27: qty 2

## 2016-06-27 MED ORDER — LORAZEPAM 0.5 MG PO TABS: 0.5000 mg | ORAL_TABLET | Freq: Three times a day (TID) | ORAL | 0 refills | Status: AC | PRN

## 2016-06-27 NOTE — ED Triage Notes (Signed)
States dizziness for 1 week, states she began "shaking" last night after work, states feeling as if she is going to pass out, awake and alert in no acute distress

## 2016-06-27 NOTE — ED Provider Notes (Addendum)
[facility] Emergency Department Provider Note       Time seen: ----------------------------------------- 6:15 PM on 06/27/2016 -----------------------------------------      I have reviewed the triage vital signs and the nursing notes.   HISTORY   Chief Complaint Dizziness    HPI Angel Brock is a 31 y.o. female who presents to the ED for dizziness on and off for work for the last week. Patient states she was shaking last night after work and states she feels like she's can pass out. She has had a tremor. She complains of generalized creatinine 10 pain. Nothing makes her symptoms better or worse.   Past Medical History:  Diagnosis Date  . Asthma   . Bilateral ovarian cysts   . Bipolar 1 disorder (HCC)   . Headache     There are no active problems to display for this patient.   Past Surgical History:  Procedure Laterality Date  . ANKLE ARTHROSCOPY Left   . APPENDECTOMY    . TONSILLECTOMY    . TUBAL LIGATION      Allergies 2,4-d dimethylamine (amisol); Geodon [ziprasidone hcl]; Norco [hydrocodone-acetaminophen]; Tramadol; Penicillins; Zofran [ondansetron hcl]; and Rocephin [ceftriaxone]  Social History Social History  Substance Use Topics  . Smoking status: Current Every Day Smoker    Packs/day: 0.50    Types: Cigarettes  . Smokeless tobacco: Never Used  . Alcohol use No     Comment: occasional    Review of Systems Constitutional: Negative for fever. Cardiovascular: Negative for chest pain. Respiratory: Negative for shortness of breath. Gastrointestinal: Negative for abdominal pain, vomiting and diarrhea. Genitourinary: Negative for dysuria. Musculoskeletal: Negative for back pain. Skin: Negative for rash. Neurological: Negative for headaches, focal weakness or numbness.Positive for tremor  10-point ROS otherwise negative.  ____________________________________________   PHYSICAL EXAM:  VITAL SIGNS: ED Triage Vitals   Enc Vitals Group     BP 06/27/16 1644 112/89     Pulse Rate 06/27/16 1644 79     Resp 06/27/16 1644 18     Temp 06/27/16 1644 98 F (36.7 C)     Temp Source 06/27/16 1644 Oral     SpO2 06/27/16 1644 100 %     Weight 06/27/16 1641 196 lb (88.9 kg)     Height 06/27/16 1641  (1.549 m)     Head Circumference --      Peak Flow --      Pain Score 06/27/16 1758 3     Pain Loc --      Pain Edu? --      Excl. in GC? --     Constitutional: Alert and oriented. Anxious, no distress Eyes: Conjunctivae are normal. PERRL. Normal extraocular movements. ENT   Head: Normocephalic and atraumatic.   Nose: No congestion/rhinnorhea.   Mouth/Throat: Mucous membranes are moist.   Neck: No stridor. Cardiovascular: Normal rate, regular rhythm. No murmurs, rubs, or gallops. Respiratory: Normal respiratory effort without tachypnea nor retractions. Breath sounds are clear and equal bilaterally. No wheezes/rales/rhonchi. Gastrointestinal: Soft and nontender. Normal bowel sounds Musculoskeletal: Nontender with normal range of motion in extremities. No lower extremity tenderness nor edema. Neurologic:  Normal speech and language. No gross focal neurologic deficits are appreciated. Tremor is noted Skin:  Skin is warm, dry and intact. No rash noted. Psychiatric: Mood and affect are normal. Speech and behavior are normal.  ____________________________________________  EKG: Interpreted by me. Sinus rhythm with a rate of 60 bpm, normal PR interval, normal QRS, normal QT.  ____________________________________________  ED COURSE:  Pertinent labs & imaging results that were available during my care of the patient were reviewed by me and considered in my medical decision making (see chart for details). Patient presents for tremulousness and dizziness, we will assess with labs and imaging as indicated.   Procedures ____________________________________________   LABS (pertinent  positives/negatives)  Labs Reviewed  BASIC METABOLIC PANEL - Abnormal; Notable for the following:       Result Value   Potassium 3.1 (*)    Glucose, Bld 107 (*)    All other components within normal limits  URINALYSIS, COMPLETE (UACMP) WITH MICROSCOPIC - Abnormal; Notable for the following:    Color, Urine YELLOW (*)    APPearance CLEAR (*)    Squamous Epithelial / LPF 0-5 (*)    All other components within normal limits  CBC  POC URINE PREG, ED  POCT PREGNANCY, URINE  POCT PREGNANCY, URINE    RADIOLOGY  Chest x-ray is normal  ____________________________________________  FINAL ASSESSMENT AND PLAN  Tremor, dizziness  Plan: Patient's labs and imaging were dictated above. Patient had presented for dizziness, symptoms seem to be anxiety related, she'll be discharged with Valium and was given a first dose here which she tolerated well. She is stable for outpatient follow-up.   [name], MD   Note: This note was generated in part or whole with voice recognition software. Voice recognition is usually quite accurate but there are transcription errors that can and very often do occur. I apologize for any typographical errors that were not detected and corrected.     [name], MD 06/27/16 1817    [name], MD 06/27/16 [phone]

## 2016-08-12 ENCOUNTER — Encounter: Payer: Self-pay | Admitting: Emergency Medicine

## 2016-08-12 ENCOUNTER — Emergency Department
Admission: EM | Admit: 2016-08-12 | Discharge: 2016-08-12 | Disposition: A | Discharge status: Home / Self Care | Payer: Self-pay | Attending: Emergency Medicine | Admitting: Emergency Medicine

## 2016-08-12 DIAGNOSIS — G43009 Migraine without aura, not intractable, without status migrainosus: Secondary | ICD-10-CM | POA: Insufficient documentation

## 2016-08-12 DIAGNOSIS — J45909 Unspecified asthma, uncomplicated: Secondary | ICD-10-CM | POA: Insufficient documentation

## 2016-08-12 DIAGNOSIS — Z79899 Other long term (current) drug therapy: Secondary | ICD-10-CM | POA: Insufficient documentation

## 2016-08-12 DIAGNOSIS — F17210 Nicotine dependence, cigarettes, uncomplicated: Secondary | ICD-10-CM | POA: Insufficient documentation

## 2016-08-12 DIAGNOSIS — J00 Acute nasopharyngitis [common cold]: Secondary | ICD-10-CM | POA: Insufficient documentation

## 2016-08-12 DIAGNOSIS — R197 Diarrhea, unspecified: Secondary | ICD-10-CM | POA: Insufficient documentation

## 2016-08-12 LAB — POCT RAPID STREP A: Streptococcus, Group A Screen (Direct): NEGATIVE

## 2016-08-12 MED ORDER — KETOROLAC TROMETHAMINE 30 MG/ML IJ SOLN
30.0000 mg | Freq: Once | INTRAMUSCULAR | Status: AC
  Administered 2016-08-12: 30 mg via INTRAMUSCULAR
  Filled 2016-08-12: qty 1

## 2016-08-12 MED ORDER — AZITHROMYCIN 250 MG PO TABS: ORAL_TABLET | ORAL | 0 refills | Status: DC

## 2016-08-12 MED ORDER — METOCLOPRAMIDE HCL 5 MG/ML IJ SOLN
5.0000 mg | Freq: Once | INTRAMUSCULAR | Status: AC
  Administered 2016-08-12: 5 mg via INTRAMUSCULAR
  Filled 2016-08-12: qty 2

## 2016-08-12 NOTE — ED Triage Notes (Signed)
Pt presents to ED with c/o headache since Saturday and diarrhea since yesterday. Pt states she woke up with sore throat today. Denies fever at home. Pt states she had to leave work early as a result of how badly she was feeling.

## 2016-08-12 NOTE — ED Notes (Signed)
Pt discharged to home.  Family member driving.  Discharge instructions reviewed.  Verbalized understanding.  No questions or concerns at this time.  Teach back verified.  Pt in NAD.  No items left in ED.   

## 2016-08-12 NOTE — ED Triage Notes (Signed)
Pt presents to ED with c/o headache that radiates across her temples since Saturday and diarrhea (X3 today). Pt states she also woke up with sore throat this morning. Denies any other symptoms. No visual changes but does report sensitivity to light and sounds.

## 2016-08-13 NOTE — ED Provider Notes (Signed)
[facility] Emergency Department Provider Note   ____________________________________________   I have reviewed the triage vital signs and the nursing notes.   HISTORY  Chief Complaint Headache; Sore Throat; and Diarrhea    HPI Angel Brock is a 31 y.o. female presents with a migraine type headache, sore throat, congestion, rhinorrhea, body aches and diarrhea x 3 days. Patient endorses light and sound sensitivity however no aura. Patient reports sick contacts from workplace. Patient denies fever, chills, chest pain, chest tightness, and shortness of breath.  Past Medical History:  Diagnosis Date  . Asthma   . Bilateral ovarian cysts   . Bipolar 1 disorder (HCC)   . Headache     There are no active problems to display for this patient.   Past Surgical History:  Procedure Laterality Date  . ANKLE ARTHROSCOPY Left   . APPENDECTOMY    . TONSILLECTOMY    . TUBAL LIGATION      Prior to Admission medications   Medication Sig Start Date End Date Taking? Authorizing Provider  acetaminophen (TYLENOL) 325 MG tablet Take 650 mg by mouth every 6 (six) hours as needed for mild pain, moderate pain or headache. For pain    [provider]  albuterol (PROVENTIL HFA;VENTOLIN HFA) 108 (90 BASE) MCG/ACT inhaler Inhale 2 puffs into the lungs every 6 (six) hours as needed for wheezing or shortness of breath.    [provider]  azithromycin (ZITHROMAX Z-PAK) 250 MG tablet Take 2 tablets (500 mg) on  Day 1,  followed by 1 tablet (250 mg) once daily on Days 2 through 5. 08/12/16   Makenzye Troutman M, PA-C  benzonatate (TESSALON PERLES) 100 MG capsule Take 1 capsule (100 mg total) by mouth every 6 (six) hours as needed for cough. 05/01/16   Rebecka ApleyWebster, Allison P, MD  butalbital-acetaminophen-caffeine (FIORICET, [facility]) [phone] MG tablet Take 1-2 tablets by mouth every 6 (six) hours as needed for headache. 03/15/16 03/15/17  Myrna BlazerSchaevitz, David Matthew, MD    ibuprofen (ADVIL,MOTRIN) 200 MG tablet Take 200 mg by mouth every 6 (six) hours as needed for mild pain or moderate pain (headaches).     [provider]  LORazepam (ATIVAN) 0.5 MG tablet Take 1 tablet (0.5 mg total) by mouth every 8 (eight) hours as needed for anxiety. 06/27/16 06/27/17  Emily FilbertWilliams, Jonathan E, MD  methocarbamol (ROBAXIN) 500 MG tablet Take 1 tablet (500 mg total) by mouth 4 (four) times daily. 03/22/16   Tommi RumpsSummers, Rhonda L, PA-C  naproxen (NAPROSYN) 500 MG tablet Take 1 tablet (500 mg total) by mouth 2 (two) times daily with a meal. 03/22/16   Tommi RumpsSummers, Rhonda L, PA-C    Allergies 2,4-d dimethylamine (amisol); Geodon [ziprasidone hcl]; Norco [hydrocodone-acetaminophen]; Tramadol; Penicillins; Zofran [ondansetron hcl]; and Rocephin [ceftriaxone]  No family history on file.  Social History Social History  Substance Use Topics  . Smoking status: Current Every Day Smoker    Packs/day: 0.50    Types: Cigarettes  . Smokeless tobacco: Never Used  . Alcohol use No     Comment: occasional    Review of Systems Constitutional:  Negative for fever/chills Eyes: Light and sound sensitivity. ENT:  Positive for sore throat.   Cardiovascular: Denies chest pain. Respiratory: Positive cough Gastrointestinal: No abdominal pain.  Nausea, vomiting, diarrhea. Genitourinary: Negative for dysuria. Musculoskeletal: Positive for generalized body aches. Skin: Positive for rash. Neurological: Positive for headaches. ____________________________________________   PHYSICAL EXAM:  VITAL SIGNS: ED Triage Vitals  Enc Vitals Group  BP 08/12/16 2015 110/63     Pulse Rate 08/12/16 2015 71     Resp 08/12/16 2015 18     Temp 08/12/16 2015 98.1 F (36.7 C)     Temp Source 08/12/16 2015 Oral     SpO2 08/12/16 2015 97 %     Weight 08/12/16 2015 205 lb (93 kg)     Height 08/12/16 2015 5' (1.524 m)     Head Circumference --      Peak Flow --      Pain Score 08/12/16 2014 8     Pain  Loc --      Pain Edu? --      Excl. in GC? --     Constitutional: Alert and oriented. Well appearing and in no acute distress.  Head: Normocephalic and atraumatic. Eyes: Conjunctivae are normal. PERRL. Nose: Congestion and rhinorrhea Mouth/Throat: Mucous membranes are moist. Oropharynx mildly erythematous.  Neck: Supple. Hematological/Lymphatic/Immunological: No cervical lymphadenopathy. Cardiovascular: Normal rate, regular rhythm. Normal distal pulses. Respiratory: Normal respiratory effort. No wheezes/rales/rhonchi. Lungs CTAB Gastrointestinal: Soft and nontender. Skin:  Skin is warm, dry and intact. No rash noted. Psychiatric: Mood and affect are normal.  ____________________________________________   LABS (all labs ordered are listed, but only abnormal results are displayed)  Labs Reviewed  CULTURE, GROUP A STREP [facility])  POCT RAPID STREP A   ____________________________________________  EKG None ____________________________________________  RADIOLOGY None ____________________________________________   PROCEDURES  Procedure(s) performed: no    Critical Care performed: no ____________________________________________   INITIAL IMPRESSION / ASSESSMENT AND PLAN / ED COURSE  Pertinent labs & imaging results that were available during my care of the patient were reviewed by me and considered in my medical decision making (see chart for details).  Patient presents to the emergency department migraine headache,  congestion, rhinorrhea, and sore throat. Symptoms are consistent with upper respiratory infection and acute migraine headache. Physical exam, labs and vital signs were reassuring. Patient's migraine resolved with Migraine headache cocktail. Patient will be given prescription for Azithromycin and recommendation to use over the counter cough and cold for symptoms management. Patient  informed of clinical course, understand medical decision-making process, and  agree with plan.  Patient was advised to follow up with PCP and was also advised to return to the emergency department for symptoms that change or worsen.      ____________________________________________   FINAL CLINICAL IMPRESSION(S) / ED DIAGNOSES  Final diagnoses:  Acute nasopharyngitis  Migraine without aura and without status migrainosus, not intractable       NEW MEDICATIONS STARTED DURING THIS VISIT:  Discharge Medication List as of 08/12/2016 11:26 PM    START taking these medications   Details  azithromycin (ZITHROMAX Z-PAK) 250 MG tablet Take 2 tablets (500 mg) on  Day 1,  followed by 1 tablet (250 mg) once daily on Days 2 through 5., Print         Note:  This document was prepared using Dragon voice recognition software and may include unintentional dictation errors.   Clois Comber, PA-C 08/13/16 1441    Jeanmarie Plant, MD 08/16/16 [phone]

## 2016-08-15 LAB — CULTURE, GROUP A STREP (THRC)

## 2016-09-09 ENCOUNTER — Emergency Department
Admission: EM | Admit: 2016-09-09 | Discharge: 2016-09-09 | Disposition: A | Discharge status: Home / Self Care | Payer: Self-pay | Attending: Emergency Medicine | Admitting: Emergency Medicine

## 2016-09-09 ENCOUNTER — Emergency Department: Payer: Self-pay

## 2016-09-09 DIAGNOSIS — J45909 Unspecified asthma, uncomplicated: Secondary | ICD-10-CM | POA: Insufficient documentation

## 2016-09-09 DIAGNOSIS — R110 Nausea: Secondary | ICD-10-CM | POA: Insufficient documentation

## 2016-09-09 DIAGNOSIS — R197 Diarrhea, unspecified: Secondary | ICD-10-CM | POA: Insufficient documentation

## 2016-09-09 DIAGNOSIS — R109 Unspecified abdominal pain: Secondary | ICD-10-CM

## 2016-09-09 DIAGNOSIS — F17210 Nicotine dependence, cigarettes, uncomplicated: Secondary | ICD-10-CM | POA: Insufficient documentation

## 2016-09-09 DIAGNOSIS — M549 Dorsalgia, unspecified: Secondary | ICD-10-CM | POA: Insufficient documentation

## 2016-09-09 DIAGNOSIS — R1011 Right upper quadrant pain: Secondary | ICD-10-CM | POA: Insufficient documentation

## 2016-09-09 DIAGNOSIS — Z79899 Other long term (current) drug therapy: Secondary | ICD-10-CM | POA: Insufficient documentation

## 2016-09-09 LAB — COMPREHENSIVE METABOLIC PANEL
ALBUMIN: 3.9 g/dL (ref 3.5–5.0)
ALK PHOS: 61 U/L (ref 38–126)
ALT: 13 U/L — ABNORMAL LOW (ref 14–54)
ANION GAP: 7 (ref 5–15)
AST: 18 U/L (ref 15–41)
BUN: 16 mg/dL (ref 6–20)
CO2: 24 mmol/L (ref 22–32)
Calcium: 8.8 mg/dL — ABNORMAL LOW (ref 8.9–10.3)
Chloride: 106 mmol/L (ref 101–111)
Creatinine, Ser: 1 mg/dL (ref 0.44–1.00)
GFR calc Af Amer: >60 mL/min (ref >60)
GFR calc non Af Amer: >60 mL/min (ref >60)
GLUCOSE: 93 mg/dL (ref 65–99)
POTASSIUM: 3.8 mmol/L (ref 3.5–5.1)
SODIUM: 137 mmol/L (ref 135–145)
Total Bilirubin: 0.5 mg/dL (ref 0.3–1.2)
Total Protein: 6.4 g/dL — ABNORMAL LOW (ref 6.5–8.1)

## 2016-09-09 LAB — URINALYSIS, COMPLETE (UACMP) WITH MICROSCOPIC
BACTERIA UA: NONE SEEN
Bilirubin Urine: NEGATIVE
Glucose, UA: NEGATIVE mg/dL
HGB URINE DIPSTICK: NEGATIVE
Ketones, ur: NEGATIVE mg/dL
Nitrite: NEGATIVE
PROTEIN: NEGATIVE mg/dL
SPECIFIC GRAVITY, URINE: 1.023 (ref 1.005–1.030)
pH: 6 (ref 5.0–8.0)

## 2016-09-09 LAB — CBC
HEMATOCRIT: 39.3 % (ref 35.0–47.0)
HEMOGLOBIN: 13.8 g/dL (ref 12.0–16.0)
MCH: 32.5 pg (ref 26.0–34.0)
MCHC: 35.2 g/dL (ref 32.0–36.0)
MCV: 92.3 fL (ref 80.0–100.0)
Platelets: 237 10*3/uL (ref 150–440)
RBC: 4.26 MIL/uL (ref 3.80–5.20)
RDW: 12.5 % (ref 11.5–14.5)
WBC: 7.9 10*3/uL (ref 3.6–11.0)

## 2016-09-09 LAB — WET PREP, GENITAL
CLUE CELLS WET PREP: NONE SEEN
Sperm: NONE SEEN
TRICH WET PREP: NONE SEEN
Yeast Wet Prep HPF POC: NONE SEEN

## 2016-09-09 LAB — CHLAMYDIA/NGC RT PCR (ARMC ONLY)
CHLAMYDIA TR: NOT DETECTED
N gonorrhoeae: NOT DETECTED

## 2016-09-09 LAB — POCT PREGNANCY, URINE: Preg Test, Ur: NEGATIVE

## 2016-09-09 LAB — LIPASE, BLOOD: Lipase: 31 U/L (ref 11–51)

## 2016-09-09 MED ORDER — PROMETHAZINE HCL 25 MG/ML IJ SOLN
25.0000 mg | Freq: Once | INTRAMUSCULAR | Status: AC
  Administered 2016-09-09: 25 mg via INTRAVENOUS
  Filled 2016-09-09: qty 1

## 2016-09-09 MED ORDER — SODIUM CHLORIDE 0.9 % IV SOLN
Freq: Once | INTRAVENOUS | Status: AC
  Administered 2016-09-09: 19:00:00 via INTRAVENOUS

## 2016-09-09 MED ORDER — SULFAMETHOXAZOLE-TRIMETHOPRIM 800-160 MG PO TABS: 1.0000 | ORAL_TABLET | Freq: Two times a day (BID) | ORAL | 0 refills | Status: DC

## 2016-09-09 MED ORDER — PROMETHAZINE HCL 12.5 MG PO TABS: 12.5000 mg | ORAL_TABLET | Freq: Four times a day (QID) | ORAL | 0 refills | Status: DC | PRN

## 2016-09-09 NOTE — ED Provider Notes (Signed)
[facility] Emergency Department Provider Note  ____________________________________________   First MD Initiated Contact with Patient 09/09/16 1859     (approximate)  I have reviewed the triage vital signs and the nursing notes.   HISTORY  Chief Complaint Abdominal Pain   HPI Angel Brock is a 31 y.o. female who presents to the emergency department for evaluation of right upper quadrant abdominal pain. Pain started 2 days ago. It awakened her from sleep. Since that time, she has had a poor appetite with diarrhea after or while eating. She denies actively vomiting, fever, or chills. She denies dysuria or frequent UTIs. She has not taken any medications for her current symptoms. She has no significant past medical history that would contribute to her symptoms. At this time, she has no PCP. She reports having a recent URI but otherwise no changes in health concerns until the abdominal pain 2 days ago.  Past Medical History:  Diagnosis Date  . Asthma   . Bilateral ovarian cysts   . Bipolar 1 disorder (HCC)   . Headache     There are no active problems to display for this patient.   Past Surgical History:  Procedure Laterality Date  . ANKLE ARTHROSCOPY Left   . APPENDECTOMY    . TONSILLECTOMY    . TUBAL LIGATION      Prior to Admission medications   Medication Sig Start Date End Date Taking? Authorizing Provider  acetaminophen (TYLENOL) 325 MG tablet Take 650 mg by mouth every 6 (six) hours as needed for mild pain, moderate pain or headache. For pain    [provider]  albuterol (PROVENTIL HFA;VENTOLIN HFA) 108 (90 BASE) MCG/ACT inhaler Inhale 2 puffs into the lungs every 6 (six) hours as needed for wheezing or shortness of breath.    [provider]  azithromycin (ZITHROMAX Z-PAK) 250 MG tablet Take 2 tablets (500 mg) on  Day 1,  followed by 1 tablet (250 mg) once daily on Days 2 through 5. 08/12/16   Little, Traci M, PA-C    benzonatate (TESSALON PERLES) 100 MG capsule Take 1 capsule (100 mg total) by mouth every 6 (six) hours as needed for cough. 05/01/16   Rebecka ApleyWebster, Allison P, MD  butalbital-acetaminophen-caffeine ([facility]ICET, [facility]) [phone] MG tablet Take 1-2 tablets by mouth every 6 (six) hours as needed for headache. 03/15/16 03/15/17  Myrna BlazerSchaevitz, David Matthew, MD  ibuprofen (ADVIL,MOTRIN) 200 MG tablet Take 200 mg by mouth every 6 (six) hours as needed for mild pain or moderate pain (headaches).     [provider]  LORazepam (ATIVAN) 0.5 MG tablet Take 1 tablet (0.5 mg total) by mouth every 8 (eight) hours as needed for anxiety. 06/27/16 06/27/17  Emily FilbertWilliams, Jonathan E, MD  methocarbamol (ROBAXIN) 500 MG tablet Take 1 tablet (500 mg total) by mouth 4 (four) times daily. 03/22/16   Tommi RumpsSummers, Rhonda L, PA-C  naproxen (NAPROSYN) 500 MG tablet Take 1 tablet (500 mg total) by mouth 2 (two) times daily with a meal. 03/22/16   Tommi RumpsSummers, Rhonda L, PA-C  promethazine (PHENERGAN) 12.5 MG tablet Take 1 tablet (12.5 mg total) by mouth every 6 (six) hours as needed for nausea or vomiting. 09/09/16   Minyon Billiter B, FNP  sulfamethoxazole-trimethoprim (BACTRIM DS,SEPTRA DS) 800-160 MG tablet Take 1 tablet by mouth 2 (two) times daily. 09/09/16   Boss Danielsen, Rulon Eisenmengerari B, FNP    Allergies 2,4-d dimethylamine (amisol); Geodon [ziprasidone hcl]; Norco [hydrocodone-acetaminophen]; Tramadol; Penicillins; Zofran [ondansetron hcl]; and Rocephin [ceftriaxone]  No family history on file.  Social History Social History  Substance Use Topics  . Smoking status: Current Every Day Smoker    Packs/day: 0.50    Types: Cigarettes  . Smokeless tobacco: Never Used  . Alcohol use No     Comment: occasional    Review of Systems  Constitutional: No fever/chills Eyes: No visual changes. ENT: No sore throat. Cardiovascular: Denies chest pain. Respiratory: Denies shortness of breath. Gastrointestinal: Positive for abdominal pain.  Positive for  nausea. Negative for vomiting.  Positive for diarrhea. Genitourinary: Negative for dysuria. Musculoskeletal: Positive for right upper back pain. Skin: Negative for rash. Neurological: Negative for headaches, focal weakness or numbness. ____________________________________________   PHYSICAL EXAM:  VITAL SIGNS: ED Triage Vitals  Enc Vitals Group     BP 09/09/16 1757 109/65     Pulse Rate 09/09/16 1757 79     Resp 09/09/16 1757 18     Temp 09/09/16 1757 98.2 F (36.8 C)     Temp Source 09/09/16 1757 Oral     SpO2 09/09/16 1757 98 %     Weight 09/09/16 1758 200 lb (90.7 kg)     Height 09/09/16 1758 5\' 1"  (1.549 m)     Head Circumference --      Peak Flow --      Pain Score 09/09/16 1757 9     Pain Loc --      Pain Edu? --      Excl. in GC? --     Constitutional: Alert and oriented. Well appearing and in no acute distress. Eyes: Conjunctivae are normal. Head: Atraumatic. Nose: No congestion/rhinnorhea. Mouth/Throat: Mucous membranes are moist.  Oropharynx non-erythematous. Neck: No stridor.   Cardiovascular: Normal rate, regular rhythm. Grossly normal heart sounds.  Good peripheral circulation. Respiratory: Normal respiratory effort.  No retractions. Lungs CTAB. Gastrointestinal: RUQ tenderness on exam. Bowel sounds present and normoactive x 4 quadrants. No RLQ rebound tenderness or guarding. Genitourinary: No discharge noted in the vaginal vault, cervix is closed without discharge at the os. No adnexal tenderness on exam. No cervical motion tenderness. Musculoskeletal: No lower extremity tenderness nor edema.  No joint effusions. Neurologic:  Normal speech and language. No gross focal neurologic deficits are appreciated. No gait instability. Skin:  Skin is warm, dry and intact. No rash noted. Psychiatric: Mood and affect are normal. Speech and behavior are normal.  ____________________________________________   LABS (all labs ordered are listed, but only abnormal results  are displayed)  Labs Reviewed  WET PREP, GENITAL - Abnormal; Notable for the following:       Result Value   WBC, Wet Prep HPF POC FEW (*)    All other components within normal limits  COMPREHENSIVE METABOLIC PANEL - Abnormal; Notable for the following:    Calcium 8.8 (*)    Total Protein 6.4 (*)    ALT 13 (*)    All other components within normal limits  URINALYSIS, COMPLETE (UACMP) WITH MICROSCOPIC - Abnormal; Notable for the following:    Color, Urine YELLOW (*)    APPearance HAZY (*)    Leukocytes, UA SMALL (*)    Squamous Epithelial / LPF 0-5 (*)    All other components within normal limits  CHLAMYDIA/NGC RT PCR ([facility] ONLY)  LIPASE, BLOOD  CBC  POC URINE PREG, ED  POCT PREGNANCY, URINE   ____________________________________________  EKG  Not indicated. ____________________________________________  RADIOLOGY  No results found.  ____________________________________________   PROCEDURES  Procedure(s) performed: None  Procedures  Critical  Care performed: No  ____________________________________________   INITIAL IMPRESSION / ASSESSMENT AND PLAN / ED COURSE  Pertinent labs & imaging results that were available during my care of the patient were reviewed by me and considered in my medical decision making (see chart for details).  31 year old female female presenting to the ER for RUQ abdominal pain, nausea, and diarrhea. At the time of exam, vital signs are reassuring as are labs. Ultrasound of the RUQ requested to rule out cholelithiasis. Phenergan and NS ordered for nausea. Patient is aware and agreeable to the plan. ----------------------------------------- 8:39 PM on 09/09/2016 -----------------------------------------  Ultrasound results reviewed with the patient who states that her nausea has improved, but she's continues to have some pain that she now describes in her mid to lower abdomen on the right side. Further investigation and she  states that she's had 2 sexual partners and has had unprotected intercourse. She is not aware of any STD exposure but cannot be sure. She states that she has had a clear vaginal discharge but otherwise nothing out of the ordinary. We discussed pelvic exam to which she agrees and orders have been placed for wet prep and chlamydia gonorrhea testing.   9:21 PM on 09/09/2016  Patient discharged home after wet prep resulted negative. No empiric treatment for chlamydia or gonorrhea will be given tonight as the vaginal exam is inconsistent with acute infection. She will be treated for a urinary tract infection and advised to follow-up with her primary care provider if the symptoms do not resolve over the next few days. She was also instructed to return to emergency department for symptoms that change or worsen if she is unable schedule an appointment.     ____________________________________________   FINAL CLINICAL IMPRESSION(S) / ED DIAGNOSES  Final diagnoses:  Abdominal pain, unspecified abdominal location      NEW MEDICATIONS STARTED DURING THIS VISIT:  Discharge Medication List as of 09/09/2016  9:25 PM    START taking these medications   Details  promethazine (PHENERGAN) 12.5 MG tablet Take 1 tablet (12.5 mg total) by mouth every 6 (six) hours as needed for nausea or vomiting., Starting Mon 09/09/2016, Print    sulfamethoxazole-trimethoprim (BACTRIM DS,SEPTRA DS) 800-160 MG tablet Take 1 tablet by mouth 2 (two) times daily., Starting Mon 09/09/2016, Print         Note:  This document was prepared using Dragon voice recognition software and may include unintentional dictation errors.    Chinita Pester, FNP 09/12/16 1305    Jeanmarie Plant, MD 09/14/16 [phone]

## 2016-09-09 NOTE — ED Triage Notes (Signed)
R mid and lower abdominal pain X 2 days, nausea and diarrhea. Pt alert and oriented X4, active, cooperative, pt in NAD. RR even and unlabored, color WNL.

## 2016-09-09 NOTE — Discharge Instructions (Signed)
Please follow up with your primary care provider for symptoms that are not improving over the next couple of days. Return to the ER for symptoms that change or worsen if unable to schedule an appointment.

## 2016-10-12 ENCOUNTER — Emergency Department
Admission: EM | Admit: 2016-10-12 | Discharge: 2016-10-12 | Disposition: A | Discharge status: Home / Self Care | Payer: Self-pay | Attending: Emergency Medicine | Admitting: Emergency Medicine

## 2016-10-12 ENCOUNTER — Encounter: Payer: Self-pay | Admitting: Medical Oncology

## 2016-10-12 DIAGNOSIS — R112 Nausea with vomiting, unspecified: Secondary | ICD-10-CM | POA: Insufficient documentation

## 2016-10-12 DIAGNOSIS — Z5321 Procedure and treatment not carried out due to patient leaving prior to being seen by health care provider: Secondary | ICD-10-CM | POA: Insufficient documentation

## 2016-10-12 DIAGNOSIS — R1030 Lower abdominal pain, unspecified: Secondary | ICD-10-CM | POA: Insufficient documentation

## 2016-10-12 DIAGNOSIS — R197 Diarrhea, unspecified: Secondary | ICD-10-CM | POA: Insufficient documentation

## 2016-10-12 LAB — COMPREHENSIVE METABOLIC PANEL
ALBUMIN: 4 g/dL (ref 3.5–5.0)
ALT: 13 U/L — AB (ref 14–54)
AST: 15 U/L (ref 15–41)
Alkaline Phosphatase: 51 U/L (ref 38–126)
Anion gap: 6 (ref 5–15)
BILIRUBIN TOTAL: 0.4 mg/dL (ref 0.3–1.2)
BUN: 16 mg/dL (ref 6–20)
CHLORIDE: 109 mmol/L (ref 101–111)
CO2: 25 mmol/L (ref 22–32)
Calcium: 9.2 mg/dL (ref 8.9–10.3)
Creatinine, Ser: 0.72 mg/dL (ref 0.44–1.00)
GFR calc Af Amer: >60 mL/min (ref >60)
GFR calc non Af Amer: >60 mL/min (ref >60)
GLUCOSE: 87 mg/dL (ref 65–99)
POTASSIUM: 3.6 mmol/L (ref 3.5–5.1)
Sodium: 140 mmol/L (ref 135–145)
Total Protein: 6.3 g/dL — ABNORMAL LOW (ref 6.5–8.1)

## 2016-10-12 LAB — URINALYSIS, COMPLETE (UACMP) WITH MICROSCOPIC
BACTERIA UA: NONE SEEN
Bilirubin Urine: NEGATIVE
Glucose, UA: NEGATIVE mg/dL
Hgb urine dipstick: NEGATIVE
Ketones, ur: NEGATIVE mg/dL
Leukocytes, UA: NEGATIVE
Nitrite: NEGATIVE
Protein, ur: NEGATIVE mg/dL
SPECIFIC GRAVITY, URINE: 1.019 (ref 1.005–1.030)
pH: 6 (ref 5.0–8.0)

## 2016-10-12 LAB — LIPASE, BLOOD: LIPASE: 33 U/L (ref 11–51)

## 2016-10-12 LAB — POCT PREGNANCY, URINE: PREG TEST UR: NEGATIVE

## 2016-10-12 LAB — CBC
HEMATOCRIT: 37.7 % (ref 35.0–47.0)
Hemoglobin: 13.4 g/dL (ref 12.0–16.0)
MCH: 33.3 pg (ref 26.0–34.0)
MCHC: 35.5 g/dL (ref 32.0–36.0)
MCV: 93.6 fL (ref 80.0–100.0)
Platelets: 188 10*3/uL (ref 150–440)
RBC: 4.03 MIL/uL (ref 3.80–5.20)
RDW: 12.4 % (ref 11.5–14.5)
WBC: 6.9 10*3/uL (ref 3.6–11.0)

## 2016-10-12 NOTE — ED Triage Notes (Signed)
Pt reports lower abd pain with NVD that has been going on for over a week.

## 2016-10-12 NOTE — ED Notes (Signed)
Per registration patient stated she had to leave to get her child. States will return.

## 2016-11-07 ENCOUNTER — Emergency Department
Admission: EM | Admit: 2016-11-07 | Discharge: 2016-11-07 | Disposition: A | Discharge status: Home / Self Care | Payer: Self-pay | Attending: Emergency Medicine | Admitting: Emergency Medicine

## 2016-11-07 ENCOUNTER — Encounter: Payer: Self-pay | Admitting: Emergency Medicine

## 2016-11-07 DIAGNOSIS — G5601 Carpal tunnel syndrome, right upper limb: Secondary | ICD-10-CM | POA: Insufficient documentation

## 2016-11-07 DIAGNOSIS — F17210 Nicotine dependence, cigarettes, uncomplicated: Secondary | ICD-10-CM | POA: Insufficient documentation

## 2016-11-07 DIAGNOSIS — J45909 Unspecified asthma, uncomplicated: Secondary | ICD-10-CM | POA: Insufficient documentation

## 2016-11-07 MED ORDER — PREDNISONE 10 MG PO TABS: ORAL_TABLET | ORAL | 0 refills | Status: DC

## 2016-11-07 NOTE — ED Notes (Signed)
See triage note  States she is having bilateral wrist pain  Pain is worse in the right  Was dx'd with carpel tunnel in past but unable to have any treatment done  States she has been wearing splints at night w/o relief

## 2016-11-07 NOTE — Discharge Instructions (Signed)
Wear wrist splint to support your  wrist. Begin taking prednisone as directed. Follow-up with your primary care provider. A referral to an orthopedist may be necessary for further evaluation of carpal tunnel syndrome.

## 2016-11-07 NOTE — ED Triage Notes (Signed)
Pt reports bilateral wrist pain for "years" but is worse today. Pt states now pain is shooting into arms. 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

## 2016-11-07 NOTE — ED Provider Notes (Signed)
[facility] Emergency Department Provider Note   ____________________________________________   First MD Initiated Contact with Patient 11/07/16 1006     (approximate)  I have reviewed the triage vital signs and the nursing notes.   HISTORY  Chief Complaint Wrist Pain   HPI Angel Brock is a 31 y.o. female is here with complaint of bilateral wrist pain for years.Patient states there is been no injury. 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 She's been alternating Tylenol and ibuprofen but today pain is increased. She now has been experiencing some pain shooting down into her fingers and radiating up her forearm. She rates her pain as a 10 over 10.   Past Medical History:  Diagnosis Date  . Asthma   . Bilateral ovarian cysts   . Bipolar 1 disorder (HCC)   . Headache     There are no active problems to display for this patient.   Past Surgical History:  Procedure Laterality Date  . ANKLE ARTHROSCOPY Left   . APPENDECTOMY    . TONSILLECTOMY    . TUBAL LIGATION      Prior to Admission medications   Medication Sig Start Date End Date Taking? Authorizing Provider  acetaminophen (TYLENOL) 325 MG tablet Take 650 mg by mouth every 6 (six) hours as needed for mild pain, moderate pain or headache. For pain    [provider]  albuterol (PROVENTIL HFA;VENTOLIN HFA) 108 (90 BASE) MCG/ACT inhaler Inhale 2 puffs into the lungs every 6 (six) hours as needed for wheezing or shortness of breath.    [provider]  butalbital-acetaminophen-caffeine Marikay Alar, ESGIC) [phone] MG tablet Take 1-2 tablets by mouth every 6 (six) hours as needed for headache. 03/15/16 03/15/17  [name], MD  ibuprofen (ADVIL,MOTRIN) 200 MG tablet Take 200 mg by mouth every 6 (six) hours as needed for mild pain or moderate pain (headaches).     [provider]  LORazepam (ATIVAN) 0.5 MG tablet Take 1 tablet (0.5 mg total) by  mouth every 8 (eight) hours as needed for anxiety. 06/27/16 06/27/17  [name], MD  predniSONE (DELTASONE) 10 MG tablet Take 6 tablets  today, on day 2 take 5 tablets, day 3 take 4 tablets, day 4 take 3 tablets, day 5 take  2 tablets and 1 tablet the last day 11/07/16   Tommi Rumps, PA-C    Allergies 2,4-d dimethylamine (amisol); Geodon [ziprasidone hcl]; Norco [hydrocodone-acetaminophen]; Tramadol; Penicillins; Zofran [ondansetron hcl]; and Rocephin [ceftriaxone]  No family history on file.  Social History Social History  Substance Use Topics  . Smoking status: Current Every Day Smoker    Packs/day: 0.50    Types: Cigarettes  . Smokeless tobacco: Never Used  . Alcohol use No     Comment: occasional    Review of Systems Constitutional: No fever/chills Cardiovascular: Denies chest pain. Respiratory: Denies shortness of breath. Gastrointestinal: No abdominal pain.  No nausea, no vomiting.   Musculoskeletal: Positive bilateral breast pain. Skin: Negative for rash. Neurological: Positive intermittent paresthesias to the digits.   ____________________________________________   PHYSICAL EXAM:  VITAL SIGNS: ED Triage Vitals  Enc Vitals Group     BP 11/07/16 1004 (!) 120/107     Pulse Rate 11/07/16 1004 76     Resp 11/07/16 1004 18     Temp 11/07/16 1004 98.8 F (37.1 C)     Temp Source 11/07/16 1004 Oral     SpO2 11/07/16 1004 96 %  Weight 11/07/16 1003 200 lb (90.7 kg)     Height 11/07/16 1003 5\' 2"  (1.575 m)     Head Circumference --      Peak Flow --      Pain Score 11/07/16 1002 10     Pain Loc --      Pain Edu? --      Excl. in GC? --    Constitutional: Alert and oriented. Well appearing and in no acute distress. Eyes: Conjunctivae are normal. PERRL. EOMI. Head: Atraumatic. Neck: No stridor.   Cardiovascular: Normal rate, regular rhythm. Grossly normal heart sounds.  Good peripheral circulation. Respiratory: Normal respiratory effort.  No  retractions. Lungs CTAB. Musculoskeletal:On examination of the wrist there is no gross deformity and no soft tissue swelling present bilaterally. Patient has good range of motion. Patient has positive Tinel's sign in her right wrist. Capillary refill is less than 3 seconds. Skin is without discoloration. Neurologic:  Normal speech and language. No gross focal neurologic deficits are appreciated.  Skin:  Skin is warm, dry and intact. No rash noted. Psychiatric: Mood and affect are normal. Speech and behavior are normal.  ____________________________________________   LABS (all labs ordered are listed, but only abnormal results are displayed)  Labs Reviewed - No data to display   PROCEDURES  Procedure(s) performed: None  Procedures  Critical Care performed: No  ____________________________________________   INITIAL IMPRESSION / ASSESSMENT AND PLAN / ED COURSE  Pertinent labs & imaging results that were available during my care of the patient were reviewed by me and considered in my medical decision making (see chart for details).  Patient was placed in a cockup wrist splint for support in her dominant hand. She'll follow up with her PCP and possible referral to an orthopedist. For now she will start on a prednisone Dosepak starting at 50 mg and taper down. She is aware to discontinue taking ibuprofen at this time but can take Tylenol intermittently for breakthrough pain. Patient was given a work note for today   ____________________________________________   FINAL CLINICAL IMPRESSION(S) / ED DIAGNOSES  Final diagnoses:  Carpal tunnel syndrome of right wrist      NEW MEDICATIONS STARTED DURING THIS VISIT:  Discharge Medication List as of 11/07/2016 10:51 AM    START taking these medications   Details  predniSONE (DELTASONE) 10 MG tablet Take 6 tablets  today, on day 2 take 5 tablets, day 3 take 4 tablets, day 4 take 3 tablets, day 5 take  2 tablets and 1 tablet the last  day, Print         Note:  This document was prepared using Dragon voice recognition software and may include unintentional dictation errors.    Tommi RumpsSummers, Rhonda L, PA-C 11/07/16 1113    Minna AntisPaduchowski, Kevin, MD 11/07/16 1534

## 2016-11-16 ENCOUNTER — Emergency Department
Admission: EM | Admit: 2016-11-16 | Discharge: 2016-11-16 | Discharge status: Against Medical Advice | Payer: Self-pay | Attending: Emergency Medicine | Admitting: Emergency Medicine

## 2016-11-16 ENCOUNTER — Encounter: Payer: Self-pay | Admitting: Emergency Medicine

## 2016-11-16 DIAGNOSIS — Z79899 Other long term (current) drug therapy: Secondary | ICD-10-CM | POA: Insufficient documentation

## 2016-11-16 DIAGNOSIS — R102 Pelvic and perineal pain: Secondary | ICD-10-CM | POA: Insufficient documentation

## 2016-11-16 DIAGNOSIS — J45909 Unspecified asthma, uncomplicated: Secondary | ICD-10-CM | POA: Insufficient documentation

## 2016-11-16 DIAGNOSIS — F17210 Nicotine dependence, cigarettes, uncomplicated: Secondary | ICD-10-CM | POA: Insufficient documentation

## 2016-11-16 DIAGNOSIS — N939 Abnormal uterine and vaginal bleeding, unspecified: Secondary | ICD-10-CM

## 2016-11-16 DIAGNOSIS — N938 Other specified abnormal uterine and vaginal bleeding: Secondary | ICD-10-CM | POA: Insufficient documentation

## 2016-11-16 LAB — URINALYSIS, COMPLETE (UACMP) WITH MICROSCOPIC
BILIRUBIN URINE: NEGATIVE
GLUCOSE, UA: NEGATIVE mg/dL
KETONES UR: NEGATIVE mg/dL
Nitrite: NEGATIVE
PROTEIN: NEGATIVE mg/dL
Specific Gravity, Urine: 1.014 (ref 1.005–1.030)
pH: 7 (ref 5.0–8.0)

## 2016-11-16 LAB — POCT PREGNANCY, URINE: Preg Test, Ur: NEGATIVE

## 2016-11-16 MED ORDER — METOCLOPRAMIDE HCL 10 MG PO TABS
10.0000 mg | ORAL_TABLET | Freq: Once | ORAL | Status: AC
  Administered 2016-11-16: 10 mg via ORAL
  Filled 2016-11-16: qty 1

## 2016-11-16 NOTE — ED Notes (Signed)
Pt leaving AMA due to childcare issues. Pt refused pelvic and specimen collection. MD York Cerise aware and AMA paperwork including AVS with pt upon leaving ED.

## 2016-11-16 NOTE — ED Notes (Signed)
Patient states she must leave immediately d/t childcare issues

## 2016-11-16 NOTE — ED Provider Notes (Signed)
[facility] Emergency Department Provider Note  ____________________________________________   First MD Initiated Contact with Patient 11/16/16 1939     (approximate)  I have reviewed the triage vital signs and the nursing notes.   HISTORY  Chief Complaint Vaginal Bleeding    HPI Angel Brock is a 31 y.o. female with medical history as listed below who presents for evaluation of vaginal bleeding today.  She states that her regular menstrual period was not due for a couple weeks.  She has also had some vaginal itching recently as well as some pain with intercourse.  She states that she does not use condoms but has only one partner.  She has not had any dysuria.  She states that the Mr. bleeding is about normal for a period but that she has been regular for a long time; in the past she was irregular but not over the last few years.  She denies fever/chills, chest pain, shortness of breath, nausea, vomiting.  She has had some mild pelvic pain.  Intercourse makes the symptoms worse, nothing makes it better.   Past Medical History:  Diagnosis Date  . Asthma   . Bilateral ovarian cysts   . Bipolar 1 disorder (HCC)   . Headache     There are no active problems to display for this patient.   Past Surgical History:  Procedure Laterality Date  . ANKLE ARTHROSCOPY Left   . APPENDECTOMY    . TONSILLECTOMY    . TUBAL LIGATION      Prior to Admission medications   Medication Sig Start Date End Date Taking? Authorizing Provider  acetaminophen (TYLENOL) 325 MG tablet Take 650 mg by mouth every 6 (six) hours as needed for mild pain, moderate pain or headache. For pain    [provider]  albuterol (PROVENTIL HFA;VENTOLIN HFA) 108 (90 BASE) MCG/ACT inhaler Inhale 2 puffs into the lungs every 6 (six) hours as needed for wheezing or shortness of breath.    [provider]  butalbital-acetaminophen-caffeine Marikay Alar, ESGIC) [phone] MG tablet  Take 1-2 tablets by mouth every 6 (six) hours as needed for headache. 03/15/16 03/15/17  [name], MD  ibuprofen (ADVIL,MOTRIN) 200 MG tablet Take 200 mg by mouth every 6 (six) hours as needed for mild pain or moderate pain (headaches).     [provider]  LORazepam (ATIVAN) 0.5 MG tablet Take 1 tablet (0.5 mg total) by mouth every 8 (eight) hours as needed for anxiety. 06/27/16 06/27/17  [name], MD  predniSONE (DELTASONE) 10 MG tablet Take 6 tablets  today, on day 2 take 5 tablets, day 3 take 4 tablets, day 4 take 3 tablets, day 5 take  2 tablets and 1 tablet the last day 11/07/16   Tommi Rumps, PA-C    Allergies 2,4-d dimethylamine (amisol); Geodon [ziprasidone hcl]; Norco [hydrocodone-acetaminophen]; Tramadol; Penicillins; Zofran [ondansetron hcl]; and Rocephin [ceftriaxone]  No family history on file.  Social History Social History  Substance Use Topics  . Smoking status: Current Every Day Smoker    Packs/day: 0.50    Types: Cigarettes  . Smokeless tobacco: Never Used  . Alcohol use No     Comment: occasional    Review of Systems Constitutional: No fever/chills Eyes: No visual changes. ENT: No sore throat. Cardiovascular: Denies chest pain. Respiratory: Denies shortness of breath. Gastrointestinal: Pelvic pain, denies nausea/vomiting/diarrhea Genitourinary: Vaginal bleeding 2 weeks before her expected menstrual cycle.  Vaginal itching.  Dyspareunia Musculoskeletal: Negative for neck  pain.  Negative for back pain. Integumentary: Negative for rash. Neurological: Negative for headaches, focal weakness or numbness.   ____________________________________________   PHYSICAL EXAM:  VITAL SIGNS: ED Triage Vitals  Enc Vitals Group     BP 11/16/16 1625 117/82     Pulse Rate 11/16/16 1625 86     Resp 11/16/16 1642 18     Temp 11/16/16 1625 98.7 F (37.1 C)     Temp Source 11/16/16 1625 Oral     SpO2 11/16/16 1625 98 %     Weight  11/16/16 1625 90.7 kg (200 lb)     Height 11/16/16 1625 1.549 m (5\' 1" )     Head Circumference --      Peak Flow --      Pain Score 11/16/16 1625 9     Pain Loc --      Pain Edu? --      Excl. in GC? --     Constitutional: Alert and oriented. Well appearing and in no acute distress. Eyes: Conjunctivae are normal.  Cardiovascular: Normal rate, regular rhythm. Good peripheral circulation. Grossly normal heart sounds. Respiratory: Normal respiratory effort.  No retractions. Lungs CTAB. Gastrointestinal: Soft and nontender. No distention.  Genitourinary: Deferred at the patient's request when she left AGAINST MEDICAL ADVICE Musculoskeletal: No lower extremity tenderness nor edema. No gross deformities of extremities. Neurologic:  Normal speech and language. No gross focal neurologic deficits are appreciated.  Skin:  Skin is warm, dry and intact. No rash noted.   ____________________________________________   LABS (all labs ordered are listed, but only abnormal results are displayed)  Labs Reviewed  URINALYSIS, COMPLETE (UACMP) WITH MICROSCOPIC - Abnormal; Notable for the following:       Result Value   Color, Urine YELLOW (*)    APPearance CLEAR (*)    Hgb urine dipstick SMALL (*)    Leukocytes, UA TRACE (*)    Bacteria, UA RARE (*)    Squamous Epithelial / LPF 0-5 (*)    All other components within normal limits  WET PREP, GENITAL  CHLAMYDIA/NGC RT PCR ([facility] ONLY)  POCT PREGNANCY, URINE  POC URINE PREG, ED   ____________________________________________  EKG  None - EKG not ordered by ED physician ____________________________________________  RADIOLOGY   No results found.  ____________________________________________   PROCEDURES  Critical Care performed: No   Procedure(s) performed:   Procedures   ____________________________________________   INITIAL IMPRESSION / ASSESSMENT AND PLAN / ED COURSE  Pertinent labs & imaging results that were  available during my care of the patient were reviewed by me and considered in my medical decision making (see chart for details).  Urine pregnancy test is negative.  Urinalysis is unremarkable.  I had a discussion with the patient about the importance of a pelvic exam and our plan was to obtain swabs to rule out STD/PID as well as BV.  Based on the evaluation I told her that we may need to order an ultrasound if there is concern for PID/TOA, although it is reassuring that she is afebrile with normal vital signs and has no tenderness to palpation of her abdomen.  Due to multiple critical patients in the emergency department my pelvic exam was delayed, and unfortunately the patient got a phone call but said that she was having childcare issues and she needed to leave immediately.  She did stay for me to provide her with discharge paperwork and she signed the [facility] form.  She understands that she can return to the  emergency department at any time but she states that she plans to follow-up with her PCP on Monday.  I gave my usual and customary return precautions.         ____________________________________________  FINAL CLINICAL IMPRESSION(S) / ED DIAGNOSES  Final diagnoses:  None     MEDICATIONS GIVEN DURING THIS VISIT:  Medications  metoCLOPramide (REGLAN) tablet 10 mg (10 mg Oral Given 11/16/16 1820)     NEW OUTPATIENT MEDICATIONS STARTED DURING THIS VISIT:  New Prescriptions   No medications on file    Modified Medications   No medications on file    Discontinued Medications   No medications on file     Note:  This document was prepared using Dragon voice recognition software and may include unintentional dictation errors.    Loleta Rose, MD 11/17/16 [phone]

## 2016-11-16 NOTE — Discharge Instructions (Signed)
As we discussed, your not pregnant today and there is no evidence of urinary tract infection.  We suggested performing a pelvic exam taking swabs to rule out infections that may be necessary to treat with antibiotics, but you have to leave against medical advise due to childcare issues.  We recommend that you either return to the emergency department at the next available opportunity for follow up as soon as possible with her primary care provider for additional evaluation.

## 2016-11-16 NOTE — ED Triage Notes (Signed)
Pt states that this morning when she was at work she started having vaginal bleeding. Pt states that she is having pain in her lower abdomen. Pt also reports some vaginal itching. Pt states that it is not time for her normal cycle. Pt denies passing clots. Pt does not appear to be in any distress at this time.

## 2016-11-24 ENCOUNTER — Emergency Department
Admission: EM | Admit: 2016-11-24 | Discharge: 2016-11-24 | Disposition: A | Discharge status: Home / Self Care | Payer: Self-pay | Attending: Emergency Medicine | Admitting: Emergency Medicine

## 2016-11-24 ENCOUNTER — Encounter: Payer: Self-pay | Admitting: Emergency Medicine

## 2016-11-24 DIAGNOSIS — Y9349 Activity, other involving dancing and other rhythmic movements: Secondary | ICD-10-CM | POA: Insufficient documentation

## 2016-11-24 DIAGNOSIS — F17210 Nicotine dependence, cigarettes, uncomplicated: Secondary | ICD-10-CM | POA: Insufficient documentation

## 2016-11-24 DIAGNOSIS — X501XXA Overexertion from prolonged static or awkward postures, initial encounter: Secondary | ICD-10-CM | POA: Insufficient documentation

## 2016-11-24 DIAGNOSIS — S29019A Strain of muscle and tendon of unspecified wall of thorax, initial encounter: Secondary | ICD-10-CM

## 2016-11-24 DIAGNOSIS — J45909 Unspecified asthma, uncomplicated: Secondary | ICD-10-CM | POA: Insufficient documentation

## 2016-11-24 DIAGNOSIS — M62838 Other muscle spasm: Secondary | ICD-10-CM

## 2016-11-24 DIAGNOSIS — Y929 Unspecified place or not applicable: Secondary | ICD-10-CM | POA: Insufficient documentation

## 2016-11-24 DIAGNOSIS — Y999 Unspecified external cause status: Secondary | ICD-10-CM | POA: Insufficient documentation

## 2016-11-24 MED ORDER — DIAZEPAM 5 MG PO TABS
5.0000 mg | ORAL_TABLET | Freq: Once | ORAL | Status: AC
  Administered 2016-11-24: 5 mg via ORAL
  Filled 2016-11-24: qty 1

## 2016-11-24 MED ORDER — DEXAMETHASONE SODIUM PHOSPHATE 10 MG/ML IJ SOLN
10.0000 mg | Freq: Once | INTRAMUSCULAR | Status: AC
  Administered 2016-11-24: 10 mg via INTRAMUSCULAR
  Filled 2016-11-24: qty 1

## 2016-11-24 MED ORDER — PREDNISONE 10 MG (21) PO TBPK: ORAL_TABLET | ORAL | 0 refills | Status: DC

## 2016-11-24 MED ORDER — CARISOPRODOL 350 MG PO TABS: 350.0000 mg | ORAL_TABLET | Freq: Three times a day (TID) | ORAL | 1 refills | Status: DC | PRN

## 2016-11-24 NOTE — ED Notes (Signed)
See triage note.

## 2016-11-24 NOTE — ED Provider Notes (Signed)
[facility] Emergency Department Provider Note   ____________________________________________   I have reviewed the triage vital signs and the nursing notes.   HISTORY  Chief Complaint Back Pain and Neck Pain    HPI Angel Brock is a 31 y.o. female presents to the emergency department with mid to upper back pain that began after attempting stretching this morning. Patient localizes her pain over the rhomboids and levator scapula a musculature. Patient exhibits muscle guarding and limited cervical range of motion due to pain. Patient denies traumatic injury related to current symptoms. Patient reports chronic back pain related to the size of her breast. Patient reported taking it her milligram Motrin without relief. Patient attempted to go to work today however had to leave early secondary to worsening pain. Patient denies radicular symptoms. Patient denies fever, chills, headache, vision changes, chest pain, chest tightness, shortness of breath, abdominal pain, nausea and vomiting.  Past Medical History:  Diagnosis Date  . Asthma   . Bilateral ovarian cysts   . Bipolar 1 disorder (HCC)   . Headache     There are no active problems to display for this patient.   Past Surgical History:  Procedure Laterality Date  . ANKLE ARTHROSCOPY Left   . APPENDECTOMY    . TONSILLECTOMY    . TUBAL LIGATION      Prior to Admission medications   Medication Sig Start Date End Date Taking? Authorizing Provider  acetaminophen (TYLENOL) 325 MG tablet Take 650 mg by mouth every 6 (six) hours as needed for mild pain, moderate pain or headache. For pain    [provider]  albuterol (PROVENTIL HFA;VENTOLIN HFA) 108 (90 BASE) MCG/ACT inhaler Inhale 2 puffs into the lungs every 6 (six) hours as needed for wheezing or shortness of breath.    [provider]  butalbital-acetaminophen-caffeine Marikay Alar, ESGIC) [phone] MG tablet Take 1-2 tablets by mouth  every 6 (six) hours as needed for headache. 03/15/16 03/15/17  [name], MD  carisoprodol (SOMA) 350 MG tablet Take 1 tablet (350 mg total) by mouth 3 (three) times daily as needed for muscle spasms. 11/24/16 11/24/17  [name], PA-C  ibuprofen (ADVIL,MOTRIN) 200 MG tablet Take 200 mg by mouth every 6 (six) hours as needed for mild pain or moderate pain (headaches).     [provider]  LORazepam (ATIVAN) 0.5 MG tablet Take 1 tablet (0.5 mg total) by mouth every 8 (eight) hours as needed for anxiety. 06/27/16 06/27/17  [name], MD  predniSONE (STERAPRED UNI-PAK 21 TAB) 10 MG (21) TBPK tablet Take 6 tablets on day 1. Take 5 tablets on day 2. Take 4 tablets on day 3. Take 3 tablets on day 4. Take 2 tablets on day 5. Take 1 tablets on day 6. 11/24/16   [name], PA-C    Allergies 2,4-d dimethylamine (amisol); Geodon [ziprasidone hcl]; Norco [hydrocodone-acetaminophen]; Tramadol; Penicillins; Zofran [ondansetron hcl]; and Rocephin [ceftriaxone]  No family history on file.  Social History Social History  Substance Use Topics  . Smoking status: Current Every Day Smoker    Packs/day: 0.50    Types: Cigarettes  . Smokeless tobacco: Never Used  . Alcohol use No     Comment: occasional    Review of Systems Constitutional: Negative for fever/chills Eyes: No visual changes. ENT:  Negative for sore throat and for difficulty swallowing Cardiovascular: Denies chest pain. Respiratory: Denies cough. Denies shortness of breath. Gastrointestinal: No abdominal pain.  No nausea, vomiting,  diarrhea. Genitourinary: Negative for dysuria. Musculoskeletal: Positive for mid and upper back pain with muscle spasms.  Skin: Negative for rash. Neurological: Negative for headaches.  ____________________________________________   PHYSICAL EXAM:  VITAL SIGNS: ED Triage Vitals  Enc Vitals Group     BP 11/24/16 1624 110/63     Pulse Rate 11/24/16 1624 80     Resp  11/24/16 1624 20     Temp 11/24/16 1624 98.7 F (37.1 C)     Temp Source 11/24/16 1624 Oral     SpO2 11/24/16 1624 97 %     Weight 11/24/16 1625 200 lb (90.7 kg)     Height 11/24/16 1625 5\' 2"  (1.575 m)     Head Circumference --      Peak Flow --      Pain Score 11/24/16 1627 9     Pain Loc --      Pain Edu? --      Excl. in GC? --     Constitutional: Alert and oriented. Well appearing and in no acute distress.  Eyes: Conjunctivae are normal. PERRL. EOMI  Head: Normocephalic and atraumatic. ENT:      Ears: Canals clear. TMs intact bilaterally.      Nose: No congestion/rhinnorhea.      Mouth/Throat: Mucous membranes are moist. Neck:Supple. No thyromegaly. No stridor.  Cardiovascular: Normal rate, regular rhythm. Normal S1 and S2.  Good peripheral circulation. Respiratory: Normal respiratory effort without tachypnea or retractions. Lungs CTAB. No wheezes/rales/rhonchi.  Hematological/Lymphatic/Immunological: No cervical lymphadenopathy. Cardiovascular: Normal rate, regular rhythm. Normal distal pulses. Gastrointestinal: Bowel sounds 4 quadrants. Soft and nontender to palpation. Musculoskeletal: Lower cervical and upper thoracic back pain. Palpable tenderness along paraspinal, rhomboids, levator scapulae and upper trapezius musculature. Muscle spasm noted over the insertion of the left levator scapulae with point tenderness. Cervical range of motion limited by muscle soreness and stiffness. Negative spinous process tenderness and no deformities noted. Negative radiculopathy. Neurologic: Normal speech and language. Skin:  Skin is warm, dry and intact. No rash noted. Psychiatric: Mood and affect are normal. Speech and behavior are normal. Patient exhibits appropriate insight and judgement.  ____________________________________________   LABS (all labs ordered are listed, but only abnormal results are displayed)  Labs Reviewed - No data to  display ____________________________________________  EKG None ____________________________________________  RADIOLOGY None ____________________________________________   PROCEDURES  Procedure(s) performed: No    Critical Care performed: no ____________________________________________   INITIAL IMPRESSION / ASSESSMENT AND PLAN / ED COURSE  Pertinent labs & imaging results that were available during my care of the patient were reviewed by me and considered in my medical decision making (see chart for details).   Patient presents to emergency department with lower cervical and upper thoracic back pain. History and physical exam findings are reassuring symptoms are consistent with muscle tension and spasms.. Patient noted improvement of symptoms following Decadron and Valium given during the course of care in the emergency department. Patient will be prescribed prednisone taper and Soma as needed for muscle spasms. Patient advised to follow up with PCP as needed or return to the emergency department if symptoms return or worsen. Patient informed of clinical course, understand medical decision-making process, and agree with plan.  ____________________________________________   FINAL CLINICAL IMPRESSION(S) / ED DIAGNOSES  Final diagnoses:  Thoracic myofascial strain, initial encounter  Muscle spasms of neck       NEW MEDICATIONS STARTED DURING THIS VISIT:  Discharge Medication List as of 11/24/2016  6:52 PM    START taking  these medications   Details  carisoprodol (SOMA) 350 MG tablet Take 1 tablet (350 mg total) by mouth 3 (three) times daily as needed for muscle spasms., Starting Sun 11/24/2016, Until Mon 11/24/2017, Print    predniSONE (STERAPRED UNI-PAK 21 TAB) 10 MG (21) TBPK tablet Take 6 tablets on day 1. Take 5 tablets on day 2. Take 4 tablets on day 3. Take 3 tablets on day 4. Take 2 tablets on day 5. Take 1 tablets on day 6., Print         Note:  This document  was prepared using Dragon voice recognition software and may include unintentional dictation errors.    Percell Boston 11/24/16 2044    [name], MD 11/24/16 2152

## 2016-11-24 NOTE — ED Triage Notes (Signed)
While stretching this morning, was doing a twisting stretch, c/o upper back  And neck pain.

## 2016-11-24 NOTE — Discharge Instructions (Signed)
Take medication as prescribed. Return to emergency department if symptoms worsen and follow-up with PCP as needed.   °

## 2016-12-17 ENCOUNTER — Emergency Department: Payer: Self-pay

## 2016-12-17 ENCOUNTER — Encounter: Payer: Self-pay | Admitting: Emergency Medicine

## 2016-12-17 ENCOUNTER — Emergency Department
Admission: EM | Admit: 2016-12-17 | Discharge: 2016-12-18 | Disposition: A | Discharge status: Home / Self Care | Payer: Self-pay | Attending: Emergency Medicine | Admitting: Emergency Medicine

## 2016-12-17 DIAGNOSIS — R1031 Right lower quadrant pain: Secondary | ICD-10-CM | POA: Insufficient documentation

## 2016-12-17 DIAGNOSIS — R1011 Right upper quadrant pain: Secondary | ICD-10-CM | POA: Insufficient documentation

## 2016-12-17 DIAGNOSIS — F17210 Nicotine dependence, cigarettes, uncomplicated: Secondary | ICD-10-CM | POA: Insufficient documentation

## 2016-12-17 DIAGNOSIS — J45909 Unspecified asthma, uncomplicated: Secondary | ICD-10-CM | POA: Insufficient documentation

## 2016-12-17 LAB — CBC
HCT: 38.4 % (ref 35.0–47.0)
HEMOGLOBIN: 13.8 g/dL (ref 12.0–16.0)
MCH: 32.9 pg (ref 26.0–34.0)
MCHC: 36 g/dL (ref 32.0–36.0)
MCV: 91.4 fL (ref 80.0–100.0)
Platelets: 229 10*3/uL (ref 150–440)
RBC: 4.2 MIL/uL (ref 3.80–5.20)
RDW: 12.1 % (ref 11.5–14.5)
WBC: 7.3 10*3/uL (ref 3.6–11.0)

## 2016-12-17 LAB — COMPREHENSIVE METABOLIC PANEL
ALT: 12 U/L — ABNORMAL LOW (ref 14–54)
AST: 15 U/L (ref 15–41)
Albumin: 3.7 g/dL (ref 3.5–5.0)
Alkaline Phosphatase: 56 U/L (ref 38–126)
Anion gap: 4 — ABNORMAL LOW (ref 5–15)
BUN: 17 mg/dL (ref 6–20)
CHLORIDE: 109 mmol/L (ref 101–111)
CO2: 27 mmol/L (ref 22–32)
Calcium: 8.9 mg/dL (ref 8.9–10.3)
Creatinine, Ser: 0.97 mg/dL (ref 0.44–1.00)
GFR calc non Af Amer: >60 mL/min (ref >60)
Glucose, Bld: 91 mg/dL (ref 65–99)
Potassium: 3.4 mmol/L — ABNORMAL LOW (ref 3.5–5.1)
SODIUM: 140 mmol/L (ref 135–145)
Total Bilirubin: 0.4 mg/dL (ref 0.3–1.2)
Total Protein: 6.4 g/dL — ABNORMAL LOW (ref 6.5–8.1)

## 2016-12-17 LAB — TROPONIN I

## 2016-12-17 LAB — LIPASE, BLOOD: Lipase: 28 U/L (ref 11–51)

## 2016-12-17 MED ORDER — ONDANSETRON HCL 4 MG/2ML IJ SOLN
4.0000 mg | Freq: Once | INTRAMUSCULAR | Status: DC
  Filled 2016-12-17: qty 2

## 2016-12-17 MED ORDER — ALUM & MAG HYDROXIDE-SIMETH 200-200-20 MG/5ML PO SUSP
30.0000 mL | Freq: Once | ORAL | Status: AC
  Administered 2016-12-17: 30 mL via ORAL
  Filled 2016-12-17: qty 30

## 2016-12-17 MED ORDER — FAMOTIDINE 20 MG PO TABS
20.0000 mg | ORAL_TABLET | Freq: Once | ORAL | Status: AC
  Administered 2016-12-17: 20 mg via ORAL
  Filled 2016-12-17: qty 1

## 2016-12-17 MED ORDER — SODIUM CHLORIDE 0.9 % IV BOLUS (SEPSIS)
1000.0000 mL | Freq: Once | INTRAVENOUS | Status: AC
  Administered 2016-12-17: 1000 mL via INTRAVENOUS

## 2016-12-17 MED ORDER — MORPHINE SULFATE (PF) 4 MG/ML IV SOLN
4.0000 mg | Freq: Once | INTRAVENOUS | Status: AC
  Administered 2016-12-17: 4 mg via INTRAVENOUS
  Filled 2016-12-17: qty 1

## 2016-12-17 MED ORDER — IOPAMIDOL (ISOVUE-300) INJECTION 61%
30.0000 mL | Freq: Once | INTRAVENOUS | Status: AC
  Administered 2016-12-17: 30 mL via ORAL

## 2016-12-17 NOTE — ED Notes (Signed)
Patient made aware of need of urine sample. States she is unable to void at this time.  

## 2016-12-17 NOTE — ED Notes (Signed)
Patient reconnected to medical equipment and repositioned for comfort.

## 2016-12-17 NOTE — ED Triage Notes (Signed)
Pt arrived to ED via EMS from home. (No report given by EMS) Pt reports sudden onset of upper epigastric pain this evening, worsening in last 2 hours. Pt denies UA, N/V symptoms.

## 2016-12-17 NOTE — ED Provider Notes (Addendum)
[facility] Emergency Department Provider Note ____________________________________________   First MD Initiated Contact with Patient 12/17/16 2109     (approximate)  I have reviewed the triage vital signs and the nursing notes.   HISTORY  Chief Complaint Abdominal Pain    HPI Angel Brock is a 31 y.o. female  with a history of asthma, bipolar d/o, and IBS who presents with epigastric abdominal pain, acute onset 9 hours as ago after she ate lunch, nonradiating, described as sharp pain, and associated with nausea but no vomiting. Patient denies any diarrhea, fever or chills, urinary symptoms or vaginal bleeding, and denies any previous history of this specific pain.  Patient denies any recent illness, sick contacts or any unusual foods. Patient denies alcohol use.    Past Medical History:  Diagnosis Date  . Asthma   . Bilateral ovarian cysts   . Bipolar 1 disorder (HCC)   . Headache     There are no active problems to display for this patient.   Past Surgical History:  Procedure Laterality Date  . ANKLE ARTHROSCOPY Left   . APPENDECTOMY    . TONSILLECTOMY    . TUBAL LIGATION      Prior to Admission medications   Medication Sig Start Date End Date Taking? Authorizing Provider  acetaminophen (TYLENOL) 325 MG tablet Take 650 mg by mouth every 6 (six) hours as needed for mild pain, moderate pain or headache. For pain    [provider]  albuterol (PROVENTIL HFA;VENTOLIN HFA) 108 (90 BASE) MCG/ACT inhaler Inhale 2 puffs into the lungs every 6 (six) hours as needed for wheezing or shortness of breath.    [provider]  butalbital-acetaminophen-caffeine Marikay Alar, ESGIC) [phone] MG tablet Take 1-2 tablets by mouth every 6 (six) hours as needed for headache. 03/15/16 03/15/17  [name], MD  carisoprodol (SOMA) 350 MG tablet Take 1 tablet (350 mg total) by mouth 3 (three) times daily as needed for muscle spasms.  11/24/16 11/24/17  Little, Traci M, PA-C  ibuprofen (ADVIL,MOTRIN) 200 MG tablet Take 200 mg by mouth every 6 (six) hours as needed for mild pain or moderate pain (headaches).     [provider]  LORazepam (ATIVAN) 0.5 MG tablet Take 1 tablet (0.5 mg total) by mouth every 8 (eight) hours as needed for anxiety. 06/27/16 06/27/17  [name], MD  predniSONE (STERAPRED UNI-PAK 21 TAB) 10 MG (21) TBPK tablet Take 6 tablets on day 1. Take 5 tablets on day 2. Take 4 tablets on day 3. Take 3 tablets on day 4. Take 2 tablets on day 5. Take 1 tablets on day 6. 11/24/16   Little, Traci M, PA-C    Allergies 2,4-d dimethylamine (amisol); Geodon [ziprasidone hcl]; Norco [hydrocodone-acetaminophen]; Tramadol; Penicillins; Zofran [ondansetron hcl]; and Rocephin [ceftriaxone]  History reviewed. No pertinent family history.  Social History Social History  Substance Use Topics  . Smoking status: Current Every Day Smoker    Packs/day: 0.50    Types: Cigarettes  . Smokeless tobacco: Never Used  . Alcohol use No     Comment: occasional    Review of Systems  Constitutional: No fever.  Eyes: No visual changes. ENT: No sore throat. Cardiovascular: Denies chest pain. Respiratory: Denies shortness of breath. Gastrointestinal: Positive for nausea. Genitourinary: Negative for dysuria.  Musculoskeletal: Negative for back pain. Skin: Negative for rash. Neurological: Negative for headaches, focal weakness or numbness.   ____________________________________________   PHYSICAL EXAM:  VITAL SIGNS: ED Triage Vitals  Enc Vitals Group     BP 12/17/16 2036 (!) 91/59     Pulse Rate 12/17/16 2036 87     Resp 12/17/16 2036 16     Temp 12/17/16 2036 98.2 F (36.8 C)     Temp Source 12/17/16 2036 Oral     SpO2 12/17/16 2036 99 %     Weight 12/17/16 2037 200 lb (90.7 kg)     Height --      Head Circumference --      Peak Flow --      Pain Score 12/17/16 2055 10     Pain Loc --      Pain Edu?  --      Excl. in GC? --     Constitutional: Alert and oriented. Uncomfortable appearing.. Eyes: Conjunctivae are normal. No scleral icterus. Head: Atraumatic. Nose: No congestion/rhinnorhea. Mouth/Throat: Mucous membranes are moist.   Neck: Normal range of motion.  Cardiovascular: Normal rate, regular rhythm. Grossly normal heart sounds.  Good peripheral circulation. Respiratory: Normal respiratory effort.  No retractions. Lungs CTAB. Gastrointestinal: Abdomen soft with moderate midline epigastric tenderness.No distention.  Genitourinary: No CVA tenderness. Musculoskeletal: No lower extremity edema.  Extremities warm and well perfused.  Neurologic:  Normal speech and language. No gross focal neurologic deficits are appreciated.  Skin:  Skin is warm and dry. No rash noted. Psychiatric: Mood and affect are normal. Speech and behavior are normal.  ____________________________________________   LABS (all labs ordered are listed, but only abnormal results are displayed)  Labs Reviewed  COMPREHENSIVE METABOLIC PANEL - Abnormal; Notable for the following:       Result Value   Potassium 3.4 (*)    Total Protein 6.4 (*)    ALT 12 (*)    Anion gap 4 (*)    All other components within normal limits  LIPASE, BLOOD  CBC  TROPONIN I  URINALYSIS, COMPLETE (UACMP) WITH MICROSCOPIC  POC URINE PREG, ED   ____________________________________________  EKG  ED ECG REPORT I, Dionne Bucy, the attending physician, personally viewed and interpreted this ECG.  Date: 12/17/2016 EKG Time: 2037 Rate: 78 Rhythm: normal sinus rhythm QRS Axis: normal Intervals: incomplete right bundle-branch block ST/T Wave abnormalities: normal Narrative Interpretation: no evidence of acute ischemia; no significant changes when compared to EKG of 06/28/2016  ____________________________________________  RADIOLOGY  [place] with no acute  findings  ____________________________________________   PROCEDURES  Procedure(s) performed: No    Critical Care performed: No ____________________________________________   INITIAL IMPRESSION / ASSESSMENT AND PLAN / ED COURSE  Pertinent labs & imaging results that were available during my care of the patient were reviewed by me and considered in my medical decision making (see chart for details).  31 year old female with past medical history as noted presents with epigastric abdominal pain for last 9 hours acute onset after eating. Vital signs are normal except for borderline low blood pressure, she is uncomfortable but not acutely ill appearing, and the only significant exam finding is epigastric tenderness. There are no peritoneal signs. Patient has had numerous prior visits to the ED for various complaints, including one visit 3 months ago for right upper quadrant pain with a negative ultrasound at that time. Differential today includes gastritis, GERD, PUD, IBS, less likely cholecystitis or biliary colic, pancreatitis or other hepatobiliary cause.  Do not susp colitis or other inflammatory cause, and no lower abd pain or other sx to suggest gyn etiology.  plan: Fluids, antiemetic, analgesia, labs, and right upper quadrant ultrasound. If negative  workup and pain improved, anticipate likely discharge home.    ----------------------------------------- 11:38 PM on 12/17/2016 -----------------------------------------  [place] is negative. Patient reports improved but persistent pain, and has persistent tenderness in the upper abdomen.  Will obtain CT to r/o other etiologies, and give GI meds.  If CT negative and pt tolerating PO, likely d/c home.    Patient will be signed out to next attending, Dr. Zenda Alpers.   ____________________________________________   FINAL CLINICAL IMPRESSION(S) / ED DIAGNOSES  Final diagnoses:  Right upper quadrant pain      NEW MEDICATIONS STARTED DURING  THIS VISIT:  New Prescriptions   No medications on file     Note:  This document was prepared using Dragon voice recognition software and may include unintentional dictation errors.    Dionne Bucy, MD 12/17/16 1610    Dionne Bucy, MD 12/18/16 [phone]

## 2016-12-17 NOTE — ED Notes (Signed)
Patient untethered from medical equipment to facilitate ambulation to the restroom. Patient ambulates without difficulty.

## 2016-12-17 NOTE — ED Notes (Signed)
Patient transported to US 

## 2016-12-18 ENCOUNTER — Encounter: Payer: Self-pay | Admitting: Radiology

## 2016-12-18 ENCOUNTER — Emergency Department: Payer: Self-pay

## 2016-12-18 LAB — URINALYSIS, COMPLETE (UACMP) WITH MICROSCOPIC
Bacteria, UA: NONE SEEN
Bilirubin Urine: NEGATIVE
GLUCOSE, UA: NEGATIVE mg/dL
KETONES UR: NEGATIVE mg/dL
Nitrite: NEGATIVE
PH: 6 (ref 5.0–8.0)
PROTEIN: NEGATIVE mg/dL
Specific Gravity, Urine: 1.027 (ref 1.005–1.030)

## 2016-12-18 LAB — POCT PREGNANCY, URINE: Preg Test, Ur: NEGATIVE

## 2016-12-18 MED ORDER — METOCLOPRAMIDE HCL 10 MG PO TABS: 10.0000 mg | ORAL_TABLET | Freq: Three times a day (TID) | ORAL | 0 refills | Status: DC | PRN

## 2016-12-18 MED ORDER — OXYCODONE-ACETAMINOPHEN 5-325 MG PO TABS: 2.0000 | ORAL_TABLET | Freq: Once | ORAL | Status: DC

## 2016-12-18 MED ORDER — SUCRALFATE 1 G PO TABS: 1.0000 g | ORAL_TABLET | Freq: Two times a day (BID) | ORAL | 0 refills | Status: DC

## 2016-12-18 MED ORDER — IOPAMIDOL (ISOVUE-300) INJECTION 61%
100.0000 mL | Freq: Once | INTRAVENOUS | Status: AC | PRN
Start: 2016-12-18 — End: 2016-12-18
  Administered 2016-12-18: 100 mL via INTRAVENOUS

## 2016-12-18 NOTE — ED Notes (Signed)
Patient finished contrast and CT notified of same. Patient tolerated well.

## 2016-12-18 NOTE — Discharge Instructions (Signed)
Please follow up with GI for further evaluation of your abdominal pain

## 2016-12-18 NOTE — ED Provider Notes (Signed)
-----------------------------------------   1:51 AM on 12/18/2016 -----------------------------------------   Blood pressure 104/70, pulse (!) 50, temperature 98.2 F (36.8 C), temperature source Oral, resp. rate 17, weight 90.7 kg (200 lb), SpO2 98 %.  Assuming care from Dr. Marisa Severin.  In short, Angel Brock is a 31 y.o. female with a chief complaint of Abdominal Pain .  Refer to the original H&P for additional details.  The current plan of care is to follow up the results of the CT Scan.   Clinical Course as of Dec 18 149  Wed Dec 18, 2016  0150 The patient's CT scan shows some diverticulosis but otherwise unremarkable. The patient states that she still does have some discomfort but it is bearable. I will discharge the patient to home and have her follow-up with GI. The patient otherwise has no concerns. She will be discharged home.  [AW]    Clinical Course User Index [AW] Rebecka Apley, MD      Rebecka Apley, MD 12/18/16 [phone]

## 2016-12-26 ENCOUNTER — Encounter: Payer: Self-pay | Admitting: Emergency Medicine

## 2016-12-26 ENCOUNTER — Emergency Department
Admission: EM | Admit: 2016-12-26 | Discharge: 2016-12-26 | Disposition: A | Discharge status: Home / Self Care | Payer: Self-pay | Attending: Emergency Medicine | Admitting: Emergency Medicine

## 2016-12-26 ENCOUNTER — Emergency Department: Payer: Self-pay

## 2016-12-26 DIAGNOSIS — F17210 Nicotine dependence, cigarettes, uncomplicated: Secondary | ICD-10-CM | POA: Insufficient documentation

## 2016-12-26 DIAGNOSIS — R6889 Other general symptoms and signs: Secondary | ICD-10-CM

## 2016-12-26 DIAGNOSIS — Z79899 Other long term (current) drug therapy: Secondary | ICD-10-CM | POA: Insufficient documentation

## 2016-12-26 DIAGNOSIS — F319 Bipolar disorder, unspecified: Secondary | ICD-10-CM | POA: Insufficient documentation

## 2016-12-26 DIAGNOSIS — R05 Cough: Secondary | ICD-10-CM | POA: Insufficient documentation

## 2016-12-26 DIAGNOSIS — J45909 Unspecified asthma, uncomplicated: Secondary | ICD-10-CM | POA: Insufficient documentation

## 2016-12-26 DIAGNOSIS — J111 Influenza due to unidentified influenza virus with other respiratory manifestations: Secondary | ICD-10-CM | POA: Insufficient documentation

## 2016-12-26 LAB — CBC WITH DIFFERENTIAL/PLATELET
BASOS PCT: 0 %
Basophils Absolute: 0 10*3/uL (ref 0–0.1)
Eosinophils Absolute: 0.1 10*3/uL (ref 0–0.7)
Eosinophils Relative: 2 %
HEMATOCRIT: 41.6 % (ref 35.0–47.0)
HEMOGLOBIN: 14.7 g/dL (ref 12.0–16.0)
LYMPHS PCT: 29 %
Lymphs Abs: 1.4 10*3/uL (ref 1.0–3.6)
MCH: 32.8 pg (ref 26.0–34.0)
MCHC: 35.3 g/dL (ref 32.0–36.0)
MCV: 92.8 fL (ref 80.0–100.0)
MONO ABS: 0.6 10*3/uL (ref 0.2–0.9)
MONOS PCT: 12 %
NEUTROS ABS: 2.7 10*3/uL (ref 1.4–6.5)
NEUTROS PCT: 57 %
Platelets: 217 10*3/uL (ref 150–440)
RBC: 4.48 MIL/uL (ref 3.80–5.20)
RDW: 12.4 % (ref 11.5–14.5)
WBC: 4.7 10*3/uL (ref 3.6–11.0)

## 2016-12-26 LAB — COMPREHENSIVE METABOLIC PANEL
ALBUMIN: 4.3 g/dL (ref 3.5–5.0)
ALK PHOS: 60 U/L (ref 38–126)
ALT: 15 U/L (ref 14–54)
ANION GAP: 10 (ref 5–15)
AST: 17 U/L (ref 15–41)
BUN: 7 mg/dL (ref 6–20)
CALCIUM: 9.3 mg/dL (ref 8.9–10.3)
CO2: 22 mmol/L (ref 22–32)
Chloride: 106 mmol/L (ref 101–111)
Creatinine, Ser: 0.79 mg/dL (ref 0.44–1.00)
GFR calc non Af Amer: >60 mL/min (ref >60)
GLUCOSE: 108 mg/dL — AB (ref 65–99)
POTASSIUM: 3.7 mmol/L (ref 3.5–5.1)
SODIUM: 138 mmol/L (ref 135–145)
Total Bilirubin: 0.6 mg/dL (ref 0.3–1.2)
Total Protein: 7 g/dL (ref 6.5–8.1)

## 2016-12-26 LAB — INFLUENZA PANEL BY PCR (TYPE A & B)
INFLBPCR: NEGATIVE
Influenza A By PCR: NEGATIVE

## 2016-12-26 MED ORDER — IBUPROFEN 600 MG PO TABS
600.0000 mg | ORAL_TABLET | Freq: Once | ORAL | Status: AC
  Administered 2016-12-26: 600 mg via ORAL
  Filled 2016-12-26: qty 1

## 2016-12-26 MED ORDER — FLUTICASONE PROPIONATE 50 MCG/ACT NA SUSP: 1.0000 | Freq: Every day | NASAL | 0 refills | Status: DC

## 2016-12-26 MED ORDER — PSEUDOEPHEDRINE HCL 30 MG PO TABS: 30.0000 mg | ORAL_TABLET | Freq: Four times a day (QID) | ORAL | 2 refills | Status: DC | PRN

## 2016-12-26 MED ORDER — IBUPROFEN 600 MG PO TABS: 600.0000 mg | ORAL_TABLET | Freq: Four times a day (QID) | ORAL | 0 refills | Status: DC | PRN

## 2016-12-26 MED ORDER — METOCLOPRAMIDE HCL 10 MG PO TABS
10.0000 mg | ORAL_TABLET | Freq: Once | ORAL | Status: AC
  Administered 2016-12-26: 10 mg via ORAL
  Filled 2016-12-26: qty 1

## 2016-12-26 NOTE — ED Provider Notes (Signed)
[facility] Emergency Department Provider Note  ____________________________________________  Time seen: Approximately 6:07 PM  I have reviewed the triage vital signs and the nursing notes.   HISTORY  Chief Complaint Fatigue   HPI Angel Brock is a 31 y.o. female with a history of asthma and smoking who presents for evaluation of flulike symptoms.patient reports fever of 100.46F, cough, congestion, headache, diarrhea, chills, and nausea since yesterday. She has had body aches which have been constant and moderate. No vomiting, no dysuria or hematuria, no shortness of breath or chest pain, no abdominal pain. Frontal moderate constant HA. No neck stiffness or rash.  Past Medical History:  Diagnosis Date  . Asthma   . Bilateral ovarian cysts   . Bipolar 1 disorder (HCC)   . Headache     There are no active problems to display for this patient.   Past Surgical History:  Procedure Laterality Date  . ANKLE ARTHROSCOPY Left   . APPENDECTOMY    . TONSILLECTOMY    . TUBAL LIGATION      Prior to Admission medications   Medication Sig Start Date End Date Taking? Authorizing Provider  acetaminophen (TYLENOL) 325 MG tablet Take 650 mg by mouth every 6 (six) hours as needed for mild pain, moderate pain or headache. For pain    [provider]  albuterol (PROVENTIL HFA;VENTOLIN HFA) 108 (90 BASE) MCG/ACT inhaler Inhale 2 puffs into the lungs every 6 (six) hours as needed for wheezing or shortness of breath.    [provider]  butalbital-acetaminophen-caffeine Marikay Alar, ESGIC) [phone] MG tablet Take 1-2 tablets by mouth every 6 (six) hours as needed for headache. 03/15/16 03/15/17  [name], MD  carisoprodol (SOMA) 350 MG tablet Take 1 tablet (350 mg total) by mouth 3 (three) times daily as needed for muscle spasms. 11/24/16 11/24/17  Little, Traci M, PA-C  fluticasone (FLONASE) 50 MCG/ACT nasal spray Place 1 spray into both  nostrils daily. 12/26/16 01/02/17  Nita Sickle, MD  ibuprofen (ADVIL,MOTRIN) 600 MG tablet Take 1 tablet (600 mg total) by mouth every 6 (six) hours as needed. 12/26/16   Nita Sickle, MD  LORazepam (ATIVAN) 0.5 MG tablet Take 1 tablet (0.5 mg total) by mouth every 8 (eight) hours as needed for anxiety. 06/27/16 06/27/17  [name], MD  metoCLOPramide (REGLAN) 10 MG tablet Take 1 tablet (10 mg total) by mouth every 8 (eight) hours as needed. 12/18/16   Rebecka Apley, MD  predniSONE (STERAPRED UNI-PAK 21 TAB) 10 MG (21) TBPK tablet Take 6 tablets on day 1. Take 5 tablets on day 2. Take 4 tablets on day 3. Take 3 tablets on day 4. Take 2 tablets on day 5. Take 1 tablets on day 6. 11/24/16   Little, Traci M, PA-C  pseudoephedrine (SUDAFED) 30 MG tablet Take 1 tablet (30 mg total) by mouth every 6 (six) hours as needed for congestion. 12/26/16 12/26/17  Nita Sickle, MD  sucralfate (CARAFATE) 1 g tablet Take 1 tablet (1 g total) by mouth 2 (two) times daily. 12/18/16   Rebecka Apley, MD    Allergies 2,4-d dimethylamine (amisol); Geodon [ziprasidone hcl]; Norco [hydrocodone-acetaminophen]; Tramadol; Penicillins; Zofran [ondansetron hcl]; and Rocephin [ceftriaxone]  No family history on file.  Social History Social History  Substance Use Topics  . Smoking status: Current Every Day Smoker    Packs/day: 0.50    Types: Cigarettes  . Smokeless tobacco: Never Used  . Alcohol use No  Comment: occasional    Review of Systems  Constitutional: + fever, chills, body aches Eyes: Negative for visual changes. ENT: Negative for sore throat. + congestion Neck: No neck pain  Cardiovascular: Negative for chest pain. Respiratory: Negative for shortness of breath. + cough Gastrointestinal: Negative for abdominal pain, vomiting. + nausea and diarrhea. Genitourinary: Negative for dysuria. Musculoskeletal: Negative for back pain. Skin: Negative for rash. Neurological: Negative  for weakness or numbness. + HA Psych: No SI or HI  ____________________________________________   PHYSICAL EXAM:  VITAL SIGNS: ED Triage Vitals  Enc Vitals Group     BP 12/26/16 1337 108/75     Pulse Rate 12/26/16 1336 81     Resp 12/26/16 1336 20     Temp 12/26/16 1336 98.7 F (37.1 C)     Temp Source 12/26/16 1336 Oral     SpO2 12/26/16 1336 97 %     Weight 12/26/16 1337 200 lb (90.7 kg)     Height 12/26/16 1337  (1.575 m)     Head Circumference --      Peak Flow --      Pain Score 12/26/16 1340 0     Pain Loc --      Pain Edu? --      Excl. in GC? --     Constitutional: Alert and oriented. Well appearing and in no apparent distress. HEENT:      Head: Normocephalic and atraumatic.         Eyes: Conjunctivae are normal. Sclera is non-icteric.       Mouth/Throat: Mucous membranes are moist.       Neck: Supple with no signs of meningismus. Cardiovascular: Regular rate and rhythm. No murmurs, gallops, or rubs. 2+ symmetrical distal pulses are present in all extremities. No JVD. Respiratory: Normal respiratory effort. Lungs are clear to auscultation bilaterally. No wheezes, crackles, or rhonchi.  Gastrointestinal: Soft, non tender, and non distended with positive bowel sounds. No rebound or guarding. Genitourinary: No CVA tenderness. Musculoskeletal: Nontender with normal range of motion in all extremities. No edema, cyanosis, or erythema of extremities. Neurologic: Normal speech and language. Face is symmetric. Moving all extremities. No gross focal neurologic deficits are appreciated. Skin: Skin is warm, dry and intact. No rash noted. Psychiatric: Mood and affect are normal. Speech and behavior are normal.  ____________________________________________   LABS (all labs ordered are listed, but only abnormal results are displayed)  Labs Reviewed  COMPREHENSIVE METABOLIC PANEL - Abnormal; Notable for the following:       Result Value   Glucose, Bld 108 (*)    All  other components within normal limits  CBC WITH DIFFERENTIAL/PLATELET  INFLUENZA PANEL BY PCR (TYPE A & B)   ____________________________________________  EKG  none  ____________________________________________  RADIOLOGY  CXR: Negative  ____________________________________________   PROCEDURES  Procedure(s) performed: None Procedures Critical Care performed:  None ____________________________________________   INITIAL IMPRESSION / ASSESSMENT AND PLAN / ED COURSE  31 y.o. female with a history of asthma and smoking who presents for evaluation of flulike symptoms since yesterday. Patient is extremely well appearing, no distress, afebrile, normal vital signs, chest x-ray negative for pneumonia. Labs within normal limits. We'll check patient for flu since she is asthmatic and within the window for Tamiflu. We'll give ibuprofen and Zofran for her body aches, headache, and nausea.    _________________________ 7:22 PM on 12/26/2016 -----------------------------------------  Flu negative. will DC home on Sudafed, ibuprofen, Flonase, follow-up with primary care doctor. Recommended return to  the emergency room for high fever, chest pain, shortness of breath.  Pertinent labs & imaging results that were available during my care of the patient were reviewed by me and considered in my medical decision making (see chart for details).    ____________________________________________   FINAL CLINICAL IMPRESSION(S) / ED DIAGNOSES  Final diagnoses:  Flu-like symptoms      NEW MEDICATIONS STARTED DURING THIS VISIT:  New Prescriptions   FLUTICASONE (FLONASE) 50 MCG/ACT NASAL SPRAY    Place 1 spray into both nostrils daily.   IBUPROFEN (ADVIL,MOTRIN) 600 MG TABLET    Take 1 tablet (600 mg total) by mouth every 6 (six) hours as needed.   PSEUDOEPHEDRINE (SUDAFED) 30 MG TABLET    Take 1 tablet (30 mg total) by mouth every 6 (six) hours as needed for congestion.     Note:  This  document was prepared using Dragon voice recognition software and may include unintentional dictation errors.    Nita Sickle, MD 12/26/16 [place]ina [place]

## 2016-12-26 NOTE — ED Triage Notes (Signed)
Patient presents to ED via POV from home with c/o cough, congestion, diarrhea, chills and fevers at home. Highest temperature per patient was 104, yesterday. Patient reports she has been taking tylenol at home which has helped with her fever.

## 2017-04-25 ENCOUNTER — Other Ambulatory Visit: Payer: Self-pay

## 2017-04-25 ENCOUNTER — Encounter: Payer: Self-pay | Admitting: *Deleted

## 2017-04-25 ENCOUNTER — Emergency Department
Admission: EM | Admit: 2017-04-25 | Discharge: 2017-04-25 | Disposition: A | Discharge status: Home / Self Care | Payer: Self-pay | Attending: Emergency Medicine | Admitting: Emergency Medicine

## 2017-04-25 DIAGNOSIS — F17210 Nicotine dependence, cigarettes, uncomplicated: Secondary | ICD-10-CM | POA: Insufficient documentation

## 2017-04-25 DIAGNOSIS — R519 Headache, unspecified: Secondary | ICD-10-CM

## 2017-04-25 DIAGNOSIS — R51 Headache: Secondary | ICD-10-CM | POA: Insufficient documentation

## 2017-04-25 DIAGNOSIS — Z79899 Other long term (current) drug therapy: Secondary | ICD-10-CM | POA: Insufficient documentation

## 2017-04-25 DIAGNOSIS — N390 Urinary tract infection, site not specified: Secondary | ICD-10-CM | POA: Insufficient documentation

## 2017-04-25 DIAGNOSIS — A084 Viral intestinal infection, unspecified: Secondary | ICD-10-CM | POA: Insufficient documentation

## 2017-04-25 DIAGNOSIS — J45909 Unspecified asthma, uncomplicated: Secondary | ICD-10-CM | POA: Insufficient documentation

## 2017-04-25 LAB — COMPREHENSIVE METABOLIC PANEL
ALBUMIN: 4 g/dL (ref 3.5–5.0)
ALT: 14 U/L (ref 14–54)
ANION GAP: 8 (ref 5–15)
AST: 16 U/L (ref 15–41)
Alkaline Phosphatase: 71 U/L (ref 38–126)
BILIRUBIN TOTAL: 0.4 mg/dL (ref 0.3–1.2)
BUN: 12 mg/dL (ref 6–20)
CHLORIDE: 107 mmol/L (ref 101–111)
CO2: 26 mmol/L (ref 22–32)
Calcium: 9.5 mg/dL (ref 8.9–10.3)
Creatinine, Ser: 0.78 mg/dL (ref 0.44–1.00)
GFR calc Af Amer: >60 mL/min (ref >60)
GLUCOSE: 75 mg/dL (ref 65–99)
Potassium: 3.9 mmol/L (ref 3.5–5.1)
Sodium: 141 mmol/L (ref 135–145)
TOTAL PROTEIN: 6.9 g/dL (ref 6.5–8.1)

## 2017-04-25 LAB — CBC
HEMATOCRIT: 42 % (ref 35.0–47.0)
HEMOGLOBIN: 14.1 g/dL (ref 12.0–16.0)
MCH: 32.3 pg (ref 26.0–34.0)
MCHC: 33.6 g/dL (ref 32.0–36.0)
MCV: 96.3 fL (ref 80.0–100.0)
Platelets: 220 10*3/uL (ref 150–440)
RBC: 4.36 MIL/uL (ref 3.80–5.20)
RDW: 12.7 % (ref 11.5–14.5)
WBC: 5.9 10*3/uL (ref 3.6–11.0)

## 2017-04-25 LAB — URINALYSIS, COMPLETE (UACMP) WITH MICROSCOPIC
BACTERIA UA: NONE SEEN
BILIRUBIN URINE: NEGATIVE
Glucose, UA: NEGATIVE mg/dL
Hgb urine dipstick: NEGATIVE
KETONES UR: NEGATIVE mg/dL
NITRITE: NEGATIVE
Protein, ur: NEGATIVE mg/dL
Specific Gravity, Urine: 1.014 (ref 1.005–1.030)
pH: 6 (ref 5.0–8.0)

## 2017-04-25 MED ORDER — NITROFURANTOIN MONOHYD MACRO 100 MG PO CAPS: 100.0000 mg | ORAL_CAPSULE | Freq: Two times a day (BID) | ORAL | 0 refills | Status: AC

## 2017-04-25 MED ORDER — METOCLOPRAMIDE HCL 10 MG PO TABS: 10.0000 mg | ORAL_TABLET | Freq: Three times a day (TID) | ORAL | 0 refills | Status: DC | PRN

## 2017-04-25 MED ORDER — DICYCLOMINE HCL 20 MG PO TABS: 20.0000 mg | ORAL_TABLET | Freq: Three times a day (TID) | ORAL | 0 refills | Status: DC | PRN

## 2017-04-25 MED ORDER — BUTALBITAL-APAP-CAFFEINE 50-325-40 MG PO TABS: 1.0000 | ORAL_TABLET | Freq: Once | ORAL | Status: DC

## 2017-04-25 NOTE — ED Notes (Signed)
POC urine pregnancy test was negative. 

## 2017-04-25 NOTE — Discharge Instructions (Signed)
Please seek medical attention for any high fevers, chest pain, shortness of breath, change in behavior, persistent vomiting, bloody stool or any other new or concerning symptoms.  

## 2017-04-25 NOTE — ED Notes (Signed)
Pt presents with headache last 3 days some nausea and vomiting. No fever. Pt is NAD at this time.

## 2017-04-25 NOTE — ED Triage Notes (Signed)
Patient c/o poor appetite, headache, nausea and vomiting and occasional diarrhea for two-three days. Patient c/o lower abdominal pain with no radiation.

## 2017-04-25 NOTE — ED Provider Notes (Signed)
[facility] Department Provider Note  ____________________________________________   I have reviewed the triage vital signs and the nursing notes.   HISTORY  Chief Complaint Abdominal Pain   History limited by: Not Limited   HPI Angel Brock is a 32 y.o. female who presents to the emergency department today with complaints of abdominal pain, diarrhea, vomiting, headache. Patient states symptoms started roughly 3 days ago. It has been persistent. Patient has tried multiple OTC medications without any relief. The patient works in [employer] and has been around multiple sick people. The patient has not had any associated fevers.     Per medical record review patient has a history of headache, asthma.   Past Medical History:  Diagnosis Date  . Asthma   . Bilateral ovarian cysts   . Bipolar 1 disorder (HCC)   . Headache     There are no active problems to display for this patient.   Past Surgical History:  Procedure Laterality Date  . ANKLE ARTHROSCOPY Left   . APPENDECTOMY    . TONSILLECTOMY    . TUBAL LIGATION      Prior to Admission medications   Medication Sig Start Date End Date Taking? Authorizing Provider  acetaminophen (TYLENOL) 325 MG tablet Take 650 mg by mouth every 6 (six) hours as needed for mild pain, moderate pain or headache. For pain    [provider]  albuterol (PROVENTIL HFA;VENTOLIN HFA) 108 (90 BASE) MCG/ACT inhaler Inhale 2 puffs into the lungs every 6 (six) hours as needed for wheezing or shortness of breath.    [provider]  carisoprodol (SOMA) 350 MG tablet Take 1 tablet (350 mg total) by mouth 3 (three) times daily as needed for muscle spasms. 11/24/16 11/24/17  Little, Traci M, PA-C  fluticasone (FLONASE) 50 MCG/ACT nasal spray Place 1 spray into both nostrils daily. 12/26/16 01/02/17  Nita Sickle, MD  ibuprofen (ADVIL,MOTRIN) 600 MG tablet Take 1 tablet (600 mg total) by  mouth every 6 (six) hours as needed. 12/26/16   Nita Sickle, MD  LORazepam (ATIVAN) 0.5 MG tablet Take 1 tablet (0.5 mg total) by mouth every 8 (eight) hours as needed for anxiety. 06/27/16 06/27/17  [name], MD  metoCLOPramide (REGLAN) 10 MG tablet Take 1 tablet (10 mg total) by mouth every 8 (eight) hours as needed. 12/18/16   Rebecka Apley, MD  predniSONE (STERAPRED UNI-PAK 21 TAB) 10 MG (21) TBPK tablet Take 6 tablets on day 1. Take 5 tablets on day 2. Take 4 tablets on day 3. Take 3 tablets on day 4. Take 2 tablets on day 5. Take 1 tablets on day 6. 11/24/16   Little, Traci M, PA-C  pseudoephedrine (SUDAFED) 30 MG tablet Take 1 tablet (30 mg total) by mouth every 6 (six) hours as needed for congestion. 12/26/16 12/26/17  Nita Sickle, MD  sucralfate (CARAFATE) 1 g tablet Take 1 tablet (1 g total) by mouth 2 (two) times daily. 12/18/16   Rebecka Apley, MD    Allergies 2,4-d dimethylamine (amisol); Geodon [ziprasidone hcl]; Norco [hydrocodone-acetaminophen]; Tramadol; Penicillins; Zofran [ondansetron hcl]; and Rocephin [ceftriaxone]  No family history on file.  Social History Social History   Tobacco Use  . Smoking status: Current Every Day Smoker    Packs/day: 0.50    Types: Cigarettes  . Smokeless tobacco: Never Used  Substance Use Topics  . Alcohol use: No    Comment: occasional  . Drug use: No  Review of Systems Constitutional: No fever/chills Eyes: No visual changes. ENT: No sore throat. Cardiovascular: Denies chest pain. Respiratory: Denies shortness of breath. Gastrointestinal: Positive for abdominal pain, vomiting and diarrhea.  Genitourinary: Negative for dysuria. Positive for increase frequency of urination. Musculoskeletal: Negative for back pain. Skin: Negative for rash. Neurological: Positive for headache.   ____________________________________________   PHYSICAL EXAM:  VITAL SIGNS: ED Triage Vitals  Enc Vitals Group     BP  04/25/17 1109 109/73     Pulse Rate 04/25/17 1109 78     Resp 04/25/17 1109 18     Temp 04/25/17 1109 98.1 F (36.7 C)     Temp Source 04/25/17 1109 Oral     SpO2 04/25/17 1109 98 %     Weight 04/25/17 1110 200 lb (90.7 kg)     Height 04/25/17 1110 5\' 1"  (1.549 m)     Head Circumference --      Peak Flow --      Pain Score 04/25/17 1109 5   Constitutional: Alert and oriented. Well appearing and in no distress. Eyes: Conjunctivae are normal.  ENT   Head: Normocephalic and atraumatic.   Nose: No congestion/rhinnorhea.   Mouth/Throat: Mucous membranes are moist.   Neck: No stridor. Hematological/Lymphatic/Immunilogical: No cervical lymphadenopathy. Cardiovascular: Normal rate, regular rhythm.  No murmurs, rubs, or gallops.  Respiratory: Normal respiratory effort without tachypnea nor retractions. Breath sounds are clear and equal bilaterally. No wheezes/rales/rhonchi. Gastrointestinal: Soft and non tender. No rebound. No guarding.  Genitourinary: Deferred Musculoskeletal: Normal range of motion in all extremities. No lower extremity edema. Neurologic:  Normal speech and language. No gross focal neurologic deficits are appreciated.  Skin:  Skin is warm, dry and intact. No rash noted. Psychiatric: Mood and affect are normal. Speech and behavior are normal. Patient exhibits appropriate insight and judgment.  ____________________________________________    LABS (pertinent positives/negatives)  CMP wnl CBC wnl UA wbc 6-30, leukocytes small  ____________________________________________   EKG  None  ____________________________________________    RADIOLOGY  None  ____________________________________________   PROCEDURES  Procedures  ____________________________________________   INITIAL IMPRESSION / ASSESSMENT AND PLAN / ED COURSE  Pertinent labs & imaging results that were available during my care of the patient were reviewed by me and considered in  my medical decision making (see chart for details).  Patient presented to the emergency department today because of concerns for multiple symptoms of her primary concern for abdominal pain nausea vomiting.  Differential would be broad including gastroenteritis, cholecystitis, gastritis, infection, urinary tract infection, pregnancy amongst other etiologies.  Workup here does not show any concerning blood work findings.  Urine is consistent with urinary tract infection.  Will plan on treating patient for this.  Additionally will give patient antiemetics and Bentyl.  Discussed findings and plan with patient.   ____________________________________________   FINAL CLINICAL IMPRESSION(S) / ED DIAGNOSES  Final diagnoses:  Viral gastroenteritis  Lower urinary tract infectious disease  Bad headache     Note: This dictation was prepared with Dragon dictation. Any transcriptional errors that result from this process are unintentional     Phineas SemenGoodman, Mohogany Toppins, MD 04/25/17 1444

## 2017-04-27 LAB — POCT PREGNANCY, URINE: Preg Test, Ur: NEGATIVE

## 2017-05-07 ENCOUNTER — Encounter: Payer: Self-pay | Admitting: Emergency Medicine

## 2017-05-07 ENCOUNTER — Emergency Department
Admission: EM | Admit: 2017-05-07 | Discharge: 2017-05-07 | Disposition: A | Discharge status: Home / Self Care | Payer: Self-pay | Attending: Emergency Medicine | Admitting: Emergency Medicine

## 2017-05-07 DIAGNOSIS — F17210 Nicotine dependence, cigarettes, uncomplicated: Secondary | ICD-10-CM | POA: Insufficient documentation

## 2017-05-07 DIAGNOSIS — R1084 Generalized abdominal pain: Secondary | ICD-10-CM | POA: Insufficient documentation

## 2017-05-07 DIAGNOSIS — Z79899 Other long term (current) drug therapy: Secondary | ICD-10-CM | POA: Insufficient documentation

## 2017-05-07 DIAGNOSIS — R197 Diarrhea, unspecified: Secondary | ICD-10-CM

## 2017-05-07 DIAGNOSIS — R112 Nausea with vomiting, unspecified: Secondary | ICD-10-CM | POA: Insufficient documentation

## 2017-05-07 DIAGNOSIS — J45909 Unspecified asthma, uncomplicated: Secondary | ICD-10-CM | POA: Insufficient documentation

## 2017-05-07 LAB — CBC
HEMATOCRIT: 39 % (ref 35.0–47.0)
HEMOGLOBIN: 13.3 g/dL (ref 12.0–16.0)
MCH: 32.5 pg (ref 26.0–34.0)
MCHC: 34.1 g/dL (ref 32.0–36.0)
MCV: 95.1 fL (ref 80.0–100.0)
Platelets: 217 10*3/uL (ref 150–440)
RBC: 4.1 MIL/uL (ref 3.80–5.20)
RDW: 12.4 % (ref 11.5–14.5)
WBC: 4.7 10*3/uL (ref 3.6–11.0)

## 2017-05-07 LAB — COMPREHENSIVE METABOLIC PANEL
ALBUMIN: 3.7 g/dL (ref 3.5–5.0)
ALK PHOS: 57 U/L (ref 38–126)
ALT: 12 U/L — ABNORMAL LOW (ref 14–54)
ANION GAP: 5 (ref 5–15)
AST: 16 U/L (ref 15–41)
BUN: 10 mg/dL (ref 6–20)
CHLORIDE: 107 mmol/L (ref 101–111)
CO2: 25 mmol/L (ref 22–32)
Calcium: 9 mg/dL (ref 8.9–10.3)
Creatinine, Ser: 0.8 mg/dL (ref 0.44–1.00)
GFR calc Af Amer: >60 mL/min (ref >60)
GFR calc non Af Amer: >60 mL/min (ref >60)
GLUCOSE: 86 mg/dL (ref 65–99)
POTASSIUM: 4.1 mmol/L (ref 3.5–5.1)
SODIUM: 137 mmol/L (ref 135–145)
Total Bilirubin: 0.6 mg/dL (ref 0.3–1.2)
Total Protein: 6.3 g/dL — ABNORMAL LOW (ref 6.5–8.1)

## 2017-05-07 LAB — URINALYSIS, COMPLETE (UACMP) WITH MICROSCOPIC
BACTERIA UA: NONE SEEN
Bilirubin Urine: NEGATIVE
Glucose, UA: NEGATIVE mg/dL
Ketones, ur: NEGATIVE mg/dL
Leukocytes, UA: NEGATIVE
Nitrite: NEGATIVE
PROTEIN: NEGATIVE mg/dL
Specific Gravity, Urine: 1.005 (ref 1.005–1.030)
WBC UA: NONE SEEN WBC/hpf (ref 0–5)
pH: 6 (ref 5.0–8.0)

## 2017-05-07 LAB — LIPASE, BLOOD: Lipase: 29 U/L (ref 11–51)

## 2017-05-07 LAB — POCT PREGNANCY, URINE: PREG TEST UR: NEGATIVE

## 2017-05-07 MED ORDER — CIPROFLOXACIN HCL 500 MG PO TABS: 500.0000 mg | ORAL_TABLET | Freq: Two times a day (BID) | ORAL | 0 refills | Status: AC

## 2017-05-07 MED ORDER — ONDANSETRON HCL 4 MG PO TABS: ORAL_TABLET | ORAL | 0 refills | Status: DC

## 2017-05-07 NOTE — ED Notes (Signed)
FIRST NURSE NOTE: Pt c/o left sided abdominal pain. Provided wheelchair. Pt alert and oriented X4, active, cooperative, pt in NAD. RR even and unlabored, color WNL.

## 2017-05-07 NOTE — ED Triage Notes (Signed)
Pt comes into the ED via POV c/o generalized abdominal pain that has been ongoing x1 week.  Patient was seen last week and diagnosed with UTI and viral gastroenteritis.  Patient has had no relief since then and continuing nausea and diarrhea.  Patient denies any fevers at home. Patient in NAD at this time with even and unlabored respirations.

## 2017-05-07 NOTE — ED Notes (Signed)
Pt alert and oriented X4, active, cooperative, pt in NAD. RR even and unlabored, color WNL.  Pt informed to return if any life threatening symptoms occur.  Discharge and followup instructions reviewed.  

## 2017-05-07 NOTE — ED Notes (Signed)
Pt recently treated for viral gastroenteritis per her report. States abdominal pain no better. Diarrhea. Pt alert and oriented X4, active, cooperative, pt in NAD. RR even and unlabored, color WNL.  Has not followed up with GI.

## 2017-05-07 NOTE — Discharge Instructions (Signed)

## 2017-05-07 NOTE — ED Provider Notes (Signed)
[facility] Emergency Department Provider Note  ____________________________________________   First MD Initiated Contact with Patient 05/07/17 1338     (approximate)  I have reviewed the triage vital signs and the nursing notes.   HISTORY  Chief Complaint Abdominal Pain    HPI Angel Brock is a 32 y.o. female with medical history as listed below who presents for evaluation of persistent nausea, vomiting, diarrhea, and mild generalized abdominal pain for nearly 2 weeks.  She was seen in this emergency department about 12 days ago and diagnosed with viral gastroenteritis and prescribed Bentyl and Reglan she says that the symptoms have persisted.  They are not getting worse but they are not getting better.  She has at least 5 episodes of watery diarrhea per day with no blood visualized.  She frequently vomits at least once in the morning but then the nausea and vomiting seem to get better throughout the day.  She describes all of the symptoms as severe, and she also describes intermittent aching abdominal pain throughout her abdomen but more notable on the left side which she describes as mild but persistent.    She denies fever/chills, chest pain, shortness of breath, and dysuria.  She reports that she has had similar issues in the past which I verified in [facility].  She does not have a doctor nor insurance.  Past Medical History:  Diagnosis Date  . Asthma   . Bilateral ovarian cysts   . Bipolar 1 disorder (HCC)   . Headache     There are no active problems to display for this patient.   Past Surgical History:  Procedure Laterality Date  . ANKLE ARTHROSCOPY Left   . APPENDECTOMY    . TONSILLECTOMY    . TUBAL LIGATION      Prior to Admission medications   Medication Sig Start Date End Date Taking? Authorizing Provider  acetaminophen (TYLENOL) 325 MG tablet Take 650 mg by mouth every 6 (six) hours as needed for mild pain, moderate pain or headache. For  pain    [provider]  albuterol (PROVENTIL HFA;VENTOLIN HFA) 108 (90 BASE) MCG/ACT inhaler Inhale 2 puffs into the lungs every 6 (six) hours as needed for wheezing or shortness of breath.    [provider]  carisoprodol (SOMA) 350 MG tablet Take 1 tablet (350 mg total) by mouth 3 (three) times daily as needed for muscle spasms. 11/24/16 11/24/17  Little, Traci M, PA-C  ciprofloxacin (CIPRO) 500 MG tablet Take 1 tablet (500 mg total) by mouth 2 (two) times daily for 5 days. 05/07/17 05/12/17  Loleta RoseForbach, Ravenna Legore, MD  dicyclomine (BENTYL) 20 MG tablet Take 1 tablet (20 mg total) by mouth 3 (three) times daily as needed (abdominal pain). 04/25/17   Phineas SemenGoodman, Graydon, MD  fluticasone (FLONASE) 50 MCG/ACT nasal spray Place 1 spray into both nostrils daily. 12/26/16 01/02/17  Nita SickleVeronese, Lane, MD  ibuprofen (ADVIL,MOTRIN) 600 MG tablet Take 1 tablet (600 mg total) by mouth every 6 (six) hours as needed. 12/26/16   Nita SickleVeronese, [place], MD  LORazepam (ATIVAN) 0.5 MG tablet Take 1 tablet (0.5 mg total) by mouth every 8 (eight) hours as needed for anxiety. 06/27/16 06/27/17  Emily FilbertWilliams, Jonathan E, MD  metoCLOPramide (REGLAN) 10 MG tablet Take 1 tablet (10 mg total) by mouth every 8 (eight) hours as needed. 12/18/16   Rebecka ApleyWebster, Allison P, MD  metoCLOPramide (REGLAN) 10 MG tablet Take 1 tablet (10 mg total) by mouth every 8 (eight) hours as needed for  nausea or vomiting. 04/25/17 04/25/18  Phineas Semen, MD  ondansetron [facility]) 4 MG tablet Take 1-2 tabs by mouth every 8 hours as needed for nausea/vomiting 05/07/17   Loleta Rose, MD  predniSONE (STERAPRED UNI-PAK 21 TAB) 10 MG (21) TBPK tablet Take 6 tablets on day 1. Take 5 tablets on day 2. Take 4 tablets on day 3. Take 3 tablets on day 4. Take 2 tablets on day 5. Take 1 tablets on day 6. 11/24/16   Little, Traci M, PA-C  pseudoephedrine (SUDAFED) 30 MG tablet Take 1 tablet (30 mg total) by mouth every 6 (six) hours as needed for congestion. 12/26/16 12/26/17   Nita Sickle, MD  sucralfate (CARAFATE) 1 g tablet Take 1 tablet (1 g total) by mouth 2 (two) times daily. 12/18/16   Rebecka Apley, MD    Allergies 2,4-d dimethylamine (amisol); Geodon [ziprasidone hcl]; Norco [hydrocodone-acetaminophen]; Tramadol; Penicillins; Zofran [ondansetron hcl]; and Rocephin [ceftriaxone]  No family history on file.  Social History Social History   Tobacco Use  . Smoking status: Current Every Day Smoker    Packs/day: 0.50    Types: Cigarettes  . Smokeless tobacco: Never Used  Substance Use Topics  . Alcohol use: No    Comment: occasional  . Drug use: No    Review of Systems Constitutional: No fever/chills Eyes: No visual changes. ENT: No sore throat. Cardiovascular: Denies chest pain. Respiratory: Denies shortness of breath. Gastrointestinal: N/V/D with abdominal pain for 2 weeks as described above Genitourinary: Negative for dysuria. Musculoskeletal: Negative for neck pain.  Negative for back pain. Integumentary: Negative for rash. Neurological: Negative for headaches, focal weakness or numbness.   ____________________________________________   PHYSICAL EXAM:  VITAL SIGNS: ED Triage Vitals [05/07/17 1120]  Enc Vitals Group     BP 104/72     Pulse Rate 72     Resp 16     Temp 98.2 F (36.8 C)     Temp Source Oral     SpO2 97 %     Weight 90.7 kg (200 lb)     Height 1.549 m (5\' 1" )     Head Circumference      Peak Flow      Pain Score 8     Pain Loc      Pain Edu?      Excl. in GC?     Constitutional: Alert and oriented. Well appearing and in no acute distress. Eyes: Conjunctivae are normal.  Head: Atraumatic. Nose: No congestion/rhinnorhea. Mouth/Throat: Mucous membranes are moist. Neck: No stridor.  No meningeal signs.   Cardiovascular: Normal rate, regular rhythm. Good peripheral circulation. Grossly normal heart sounds. Respiratory: Normal respiratory effort.  No retractions. Lungs CTAB. Gastrointestinal:  Moderate obesity, soft, very mild diffuse tenderness throughout the abdomen with no focal tenderness, no rebound, no guarding. No distention.  Musculoskeletal: No lower extremity tenderness nor edema. No gross deformities of extremities. Neurologic:  Normal speech and language. No gross focal neurologic deficits are appreciated.  Skin:  Skin is warm, dry and intact. No rash noted. Psychiatric: Mood and affect are normal. Speech and behavior are normal.  ____________________________________________   LABS (all labs ordered are listed, but only abnormal results are displayed)  Labs Reviewed  COMPREHENSIVE METABOLIC PANEL - Abnormal; Notable for the following components:      Result Value   Total Protein 6.3 (*)    ALT 12 (*)    All other components within normal limits  URINALYSIS, COMPLETE (UACMP) WITH MICROSCOPIC - Abnormal;  Notable for the following components:   Color, Urine STRAW (*)    APPearance CLEAR (*)    Hgb urine dipstick SMALL (*)    Squamous Epithelial / LPF 0-5 (*)    All other components within normal limits  LIPASE, BLOOD  CBC  POC URINE PREG, ED  POCT PREGNANCY, URINE   ____________________________________________  EKG  None - EKG not ordered by ED physician ____________________________________________  RADIOLOGY   ED MD interpretation: No imaging indicated  Official radiology report(s): No results found.  ____________________________________________   PROCEDURES  Critical Care performed: No   Procedure(s) performed:   Procedures   ____________________________________________   INITIAL IMPRESSION / ASSESSMENT AND PLAN / ED COURSE  As part of my medical decision making, I reviewed the following data within the electronic MEDICAL RECORD NUMBER Nursing notes reviewed and incorporated, Labs reviewed , Old chart reviewed and Notes from prior ED visits    Differential diagnosis includes, but is not limited to, ovarian cyst, ovarian torsion, acute  appendicitis, diverticulitis, urinary tract infection/pyelonephritis, endometriosis, bowel obstruction, colitis, renal colic, gastroenteritis, hernia, fibroids, endometriosis, pregnancy related pain including ectopic pregnancy, etc.  This patient's lab work is very reassuring with no acute abnormalities identified including no LFT elevation, no lipase elevation, normal electrolytes, no leukocytosis, etc.  Vital signs are normal and stable with no fever and no tachycardia.  Her physical exam is very reassuring with no focal tenderness.  Given that she has had nearly 2 weeks of diarrhea as her primary complaint with some generalized abdominal pain and some nausea and vomiting as well, I think it is appropriate to try empiric antibiotics for treatment of nonspecific infectious diarrhea.  I reviewed the recommendations and it seems that Cipro alone is adequate empiric treatment given that I am not concerned about diverticulitis at this point, nor about more exotic infections such as Entamoeba histolytica or Giardia.    I discussed the plan with the patient including my recommendations to follow-up as an outpatient with her prior primary care provider or even to establish care with a GI provider.  She Artie has Bentyl which I think is appropriate and I am giving her prescription for Zofran as well which may work better than the Reglan.  I gave my usual and customary return precautions.  She understands and agrees with the plan.    ____________________________________________  FINAL CLINICAL IMPRESSION(S) / ED DIAGNOSES  Final diagnoses:  Nausea vomiting and diarrhea  Generalized abdominal pain     MEDICATIONS GIVEN DURING THIS VISIT:  Medications - No data to display   ED Discharge Orders        Ordered    ciprofloxacin (CIPRO) 500 MG tablet  2 times daily     05/07/17 1349    ondansetron (ZOFRAN) 4 MG tablet     05/07/17 1349       Note:  This document was prepared using Dragon voice  recognition software and may include unintentional dictation errors.    Loleta Rose, MD 05/07/17 1355

## 2017-05-15 ENCOUNTER — Emergency Department
Admission: EM | Admit: 2017-05-15 | Discharge: 2017-05-15 | Disposition: A | Discharge status: Home / Self Care | Payer: Self-pay | Attending: Emergency Medicine | Admitting: Emergency Medicine

## 2017-05-15 ENCOUNTER — Encounter: Payer: Self-pay | Admitting: Emergency Medicine

## 2017-05-15 DIAGNOSIS — Z79899 Other long term (current) drug therapy: Secondary | ICD-10-CM | POA: Insufficient documentation

## 2017-05-15 DIAGNOSIS — Y999 Unspecified external cause status: Secondary | ICD-10-CM | POA: Insufficient documentation

## 2017-05-15 DIAGNOSIS — X58XXXA Exposure to other specified factors, initial encounter: Secondary | ICD-10-CM | POA: Insufficient documentation

## 2017-05-15 DIAGNOSIS — F17210 Nicotine dependence, cigarettes, uncomplicated: Secondary | ICD-10-CM | POA: Insufficient documentation

## 2017-05-15 DIAGNOSIS — J45909 Unspecified asthma, uncomplicated: Secondary | ICD-10-CM | POA: Insufficient documentation

## 2017-05-15 DIAGNOSIS — S239XXA Sprain of unspecified parts of thorax, initial encounter: Secondary | ICD-10-CM | POA: Insufficient documentation

## 2017-05-15 DIAGNOSIS — Y929 Unspecified place or not applicable: Secondary | ICD-10-CM | POA: Insufficient documentation

## 2017-05-15 DIAGNOSIS — Y939 Activity, unspecified: Secondary | ICD-10-CM | POA: Insufficient documentation

## 2017-05-15 LAB — URINALYSIS, COMPLETE (UACMP) WITH MICROSCOPIC
BILIRUBIN URINE: NEGATIVE
GLUCOSE, UA: NEGATIVE mg/dL
HGB URINE DIPSTICK: NEGATIVE
KETONES UR: NEGATIVE mg/dL
NITRITE: NEGATIVE
PROTEIN: NEGATIVE mg/dL
Specific Gravity, Urine: 1.021 (ref 1.005–1.030)
pH: 6 (ref 5.0–8.0)

## 2017-05-15 LAB — POCT PREGNANCY, URINE: Preg Test, Ur: NEGATIVE

## 2017-05-15 MED ORDER — NABUMETONE 750 MG PO TABS: 750.0000 mg | ORAL_TABLET | Freq: Two times a day (BID) | ORAL | 0 refills | Status: DC

## 2017-05-15 MED ORDER — CYCLOBENZAPRINE HCL 5 MG PO TABS: 5.0000 mg | ORAL_TABLET | Freq: Three times a day (TID) | ORAL | 0 refills | Status: DC | PRN

## 2017-05-15 MED ORDER — CYCLOBENZAPRINE HCL 10 MG PO TABS
10.0000 mg | ORAL_TABLET | Freq: Once | ORAL | Status: AC
  Administered 2017-05-15: 10 mg via ORAL
  Filled 2017-05-15: qty 1

## 2017-05-15 NOTE — Discharge Instructions (Signed)
Your exam is consistent with a midback muscle strain. You should take the prescription meds as directed. Take the muscle relaxant at bedtime, and avoid taking it prior to work. See your provider for continued symptoms.

## 2017-05-15 NOTE — ED Provider Notes (Signed)
[facility] Emergency Department Provider Note ____________________________________________  Time seen: 1144  I have reviewed the triage vital signs and the nursing notes.  HISTORY  Chief Complaint  Back Pain  HPI Angel Brock is a 32 y.o. female presents to the ED for an evaluation of 4-day complaint of right-sided low back pain. She denies any injury, accident, or trauma.  She reports increased pain with physical work activities that include lifting.  She localizes her pain to right thoracic region, over the posterior rib cage.  She noted the pain upon awakening 4 days prior.  She denies any pain, shortness of breath, distal paresthesias, footdrop, or incontinence.  No flank pain, dysuria, or hematuria.  Past Medical History:  Diagnosis Date  . Asthma   . Bilateral ovarian cysts   . Bipolar 1 disorder (HCC)   . Headache     There are no active problems to display for this patient.   Past Surgical History:  Procedure Laterality Date  . ANKLE ARTHROSCOPY Left   . APPENDECTOMY    . TONSILLECTOMY    . TUBAL LIGATION      Prior to Admission medications   Medication Sig Start Date End Date Taking? Authorizing Provider  acetaminophen (TYLENOL) 325 MG tablet Take 650 mg by mouth every 6 (six) hours as needed for mild pain, moderate pain or headache. For pain    [provider]  albuterol (PROVENTIL HFA;VENTOLIN HFA) 108 (90 BASE) MCG/ACT inhaler Inhale 2 puffs into the lungs every 6 (six) hours as needed for wheezing or shortness of breath.    [provider]  carisoprodol (SOMA) 350 MG tablet Take 1 tablet (350 mg total) by mouth 3 (three) times daily as needed for muscle spasms. 11/24/16 11/24/17  Little, Traci M, PA-C  cyclobenzaprine (FLEXERIL) 5 MG tablet Take 1 tablet (5 mg total) by mouth 3 (three) times daily as needed for muscle spasms. 05/15/17   Cammy Sanjurjo, Charlesetta Ivory, PA-C  dicyclomine (BENTYL) 20 MG tablet Take 1 tablet (20 mg  total) by mouth 3 (three) times daily as needed (abdominal pain). 04/25/17   Phineas Semen, MD  fluticasone (FLONASE) 50 MCG/ACT nasal spray Place 1 spray into both nostrils daily. 12/26/16 01/02/17  Nita Sickle, MD  ibuprofen (ADVIL,MOTRIN) 600 MG tablet Take 1 tablet (600 mg total) by mouth every 6 (six) hours as needed. 12/26/16   Nita Sickle, MD  LORazepam (ATIVAN) 0.5 MG tablet Take 1 tablet (0.5 mg total) by mouth every 8 (eight) hours as needed for anxiety. 06/27/16 06/27/17  [name], MD  metoCLOPramide (REGLAN) 10 MG tablet Take 1 tablet (10 mg total) by mouth every 8 (eight) hours as needed. 12/18/16   Rebecka Apley, MD  metoCLOPramide (REGLAN) 10 MG tablet Take 1 tablet (10 mg total) by mouth every 8 (eight) hours as needed for nausea or vomiting. 04/25/17 04/25/18  Phineas Semen, MD  nabumetone (RELAFEN) 750 MG tablet Take 1 tablet (750 mg total) by mouth 2 (two) times daily. 05/15/17   Bellatrix Devonshire, Charlesetta Ivory, PA-C  ondansetron (ZOFRAN) 4 MG tablet Take 1-2 tabs by mouth every 8 hours as needed for nausea/vomiting 05/07/17   Loleta Rose, MD  predniSONE (STERAPRED UNI-PAK 21 TAB) 10 MG (21) TBPK tablet Take 6 tablets on day 1. Take 5 tablets on day 2. Take 4 tablets on day 3. Take 3 tablets on day 4. Take 2 tablets on day 5. Take 1 tablets on day 6. 11/24/16   Little,  Traci M, PA-C  pseudoephedrine (SUDAFED) 30 MG tablet Take 1 tablet (30 mg total) by mouth every 6 (six) hours as needed for congestion. 12/26/16 12/26/17  Nita SickleVeronese, Drake, MD  sucralfate (CARAFATE) 1 g tablet Take 1 tablet (1 g total) by mouth 2 (two) times daily. 12/18/16   Rebecka ApleyWebster, Allison P, MD    Allergies 2,4-d dimethylamine (amisol); Geodon [ziprasidone hcl]; Norco [hydrocodone-acetaminophen]; Tramadol; Penicillins; Zofran [ondansetron hcl]; and Rocephin [ceftriaxone]  No family history on file.  Social History Social History   Tobacco Use  . Smoking status: Current Every Day Smoker     Packs/day: 0.50    Types: Cigarettes  . Smokeless tobacco: Never Used  Substance Use Topics  . Alcohol use: No    Comment: occasional  . Drug use: No    Review of Systems  Constitutional: Negative for fever. Cardiovascular: Negative for chest pain. Respiratory: Negative for shortness of breath. Gastrointestinal: Negative for abdominal pain, vomiting and diarrhea. Genitourinary: Negative for dysuria. Musculoskeletal: Positive for midback pain. Skin: Negative for rash. Neurological: Negative for headaches, focal weakness or numbness. ____________________________________________  PHYSICAL EXAM:  VITAL SIGNS: ED Triage Vitals  Enc Vitals Group     BP 05/15/17 1116 114/79     Pulse Rate 05/15/17 1116 (!) 111     Resp 05/15/17 1116 20     Temp 05/15/17 1116 97.9 F (36.6 C)     Temp Source 05/15/17 1116 Oral     SpO2 05/15/17 1116 98 %     Weight 05/15/17 1117 200 lb (90.7 kg)     Height 05/15/17 1117 5\' 1"  (1.549 m)     Head Circumference --      Peak Flow --      Pain Score 05/15/17 1117 10     Pain Loc --      Pain Edu? --      Excl. in GC? --     Constitutional: Alert and oriented. Well appearing and in no distress. Head: Normocephalic and atraumatic. Cardiovascular: Normal rate, regular rhythm. Normal distal pulses. Respiratory: Normal respiratory effort. No wheezes/rales/rhonchi. Gastrointestinal: Soft and nontender. No distention. Musculoskeletal: Normal spinal alignment without midline tenderness, spasm, deformity, or step-off.  Normal rotator cuff resistance testing.  Normal lumbar flexion and extension range.  Nontender with normal range of motion in all extremities.  Neurologic:  Normal gait without ataxia. Normal speech and language. No gross focal neurologic deficits are appreciated. Skin:  Skin is warm, dry and intact. No rash noted. Psychiatric: Mood and affect are normal. Patient exhibits appropriate insight and  judgment. ____________________________________________   LABS (pertinent positives/negatives)  Labs Reviewed  URINALYSIS, COMPLETE (UACMP) WITH MICROSCOPIC - Abnormal; Notable for the following components:      Result Value   Color, Urine YELLOW (*)    APPearance CLOUDY (*)    Leukocytes, UA MODERATE (*)    Bacteria, UA RARE (*)    Squamous Epithelial / LPF 6-30 (*)    All other components within normal limits  POCT PREGNANCY, URINE  ____________________________________________  PROCEDURES  Procedures Flexeril 10 mg PO ____________________________________________  INITIAL IMPRESSION / ASSESSMENT AND PLAN / ED COURSE  Patient with a ED evaluation of a 4-day complaint of right-sided thoracic muscle strain.  Her exam is overall benign at this time.  No acute trauma is noted.  She likely has a thoracic muscle strain without indication of a cardiopulmonary process.  She is discharged with a prescription for Flexeril and Relafen to dose as directed.  She is  advised to follow-up with her primary provider or return to the ED as needed.  Work note is provided for 2 days as requested. ____________________________________________  FINAL CLINICAL IMPRESSION(S) / ED DIAGNOSES  Final diagnoses:  Thoracic back sprain, initial encounter      Lissa Hoard, PA-C 05/15/17 1606    Governor Rooks, MD 05/18/17 [phone]

## 2017-05-15 NOTE — ED Triage Notes (Signed)
Pt reports lower right side back pain for the past 4 days. Pt denies obvious injures but reports she does lift a lot at work but states she is unsure if it work related because she didn't feell it until she fell alseep and got up. Pt denies pain that radiates.

## 2017-05-15 NOTE — ED Notes (Signed)
See triage note  Presents with right lower back pain for about 4 days  States pain is non radiating and denies any urinary sxs'  Has tried OTC meds w/o relief

## 2017-05-29 ENCOUNTER — Emergency Department
Admission: EM | Admit: 2017-05-29 | Discharge: 2017-05-29 | Disposition: A | Discharge status: Home / Self Care | Payer: Self-pay | Attending: Student in an Organized Health Care Education/Training Program | Admitting: Student in an Organized Health Care Education/Training Program

## 2017-05-29 ENCOUNTER — Emergency Department: Payer: Self-pay

## 2017-05-29 ENCOUNTER — Other Ambulatory Visit: Payer: Self-pay

## 2017-05-29 ENCOUNTER — Encounter: Payer: Self-pay | Admitting: Emergency Medicine

## 2017-05-29 DIAGNOSIS — R519 Headache, unspecified: Secondary | ICD-10-CM

## 2017-05-29 DIAGNOSIS — R079 Chest pain, unspecified: Secondary | ICD-10-CM

## 2017-05-29 DIAGNOSIS — J45909 Unspecified asthma, uncomplicated: Secondary | ICD-10-CM | POA: Insufficient documentation

## 2017-05-29 DIAGNOSIS — Z79899 Other long term (current) drug therapy: Secondary | ICD-10-CM | POA: Insufficient documentation

## 2017-05-29 DIAGNOSIS — R0789 Other chest pain: Secondary | ICD-10-CM | POA: Insufficient documentation

## 2017-05-29 DIAGNOSIS — F17210 Nicotine dependence, cigarettes, uncomplicated: Secondary | ICD-10-CM | POA: Insufficient documentation

## 2017-05-29 DIAGNOSIS — R51 Headache: Secondary | ICD-10-CM | POA: Insufficient documentation

## 2017-05-29 LAB — BASIC METABOLIC PANEL
Anion gap: 10 (ref 5–15)
BUN: 14 mg/dL (ref 6–20)
CALCIUM: 8.7 mg/dL — AB (ref 8.9–10.3)
CO2: 20 mmol/L — ABNORMAL LOW (ref 22–32)
Chloride: 108 mmol/L (ref 101–111)
Creatinine, Ser: 0.74 mg/dL (ref 0.44–1.00)
GFR calc Af Amer: >60 mL/min (ref >60)
GLUCOSE: 104 mg/dL — AB (ref 65–99)
Potassium: 4.3 mmol/L (ref 3.5–5.1)
Sodium: 138 mmol/L (ref 135–145)

## 2017-05-29 LAB — CBC
HCT: 40.5 % (ref 35.0–47.0)
Hemoglobin: 13.7 g/dL (ref 12.0–16.0)
MCH: 32.3 pg (ref 26.0–34.0)
MCHC: 34 g/dL (ref 32.0–36.0)
MCV: 95.1 fL (ref 80.0–100.0)
Platelets: 208 10*3/uL (ref 150–440)
RBC: 4.26 MIL/uL (ref 3.80–5.20)
RDW: 12.6 % (ref 11.5–14.5)
WBC: 5.1 10*3/uL (ref 3.6–11.0)

## 2017-05-29 LAB — TROPONIN I

## 2017-05-29 MED ORDER — DEXAMETHASONE SODIUM PHOSPHATE 10 MG/ML IJ SOLN
INTRAMUSCULAR | Status: AC
  Administered 2017-05-29: 10 mg via INTRAVENOUS
  Filled 2017-05-29: qty 1

## 2017-05-29 MED ORDER — PROCHLORPERAZINE EDISYLATE 5 MG/ML IJ SOLN
INTRAMUSCULAR | Status: AC
  Administered 2017-05-29: 10 mg via INTRAVENOUS
  Filled 2017-05-29: qty 2

## 2017-05-29 MED ORDER — KETOROLAC TROMETHAMINE 30 MG/ML IJ SOLN
INTRAMUSCULAR | Status: AC
  Filled 2017-05-29: qty 1

## 2017-05-29 MED ORDER — ACETAMINOPHEN 500 MG PO TABS
ORAL_TABLET | ORAL | Status: AC
  Administered 2017-05-29: 1000 mg via ORAL
  Filled 2017-05-29: qty 2

## 2017-05-29 MED ORDER — ACETAMINOPHEN 500 MG PO TABS
1000.0000 mg | ORAL_TABLET | Freq: Once | ORAL | Status: AC
  Administered 2017-05-29: 1000 mg via ORAL

## 2017-05-29 MED ORDER — PROCHLORPERAZINE EDISYLATE 5 MG/ML IJ SOLN
10.0000 mg | Freq: Once | INTRAMUSCULAR | Status: AC
  Administered 2017-05-29: 10 mg via INTRAVENOUS

## 2017-05-29 MED ORDER — DIPHENHYDRAMINE HCL 50 MG/ML IJ SOLN
INTRAMUSCULAR | Status: AC
  Administered 2017-05-29: 25 mg via INTRAVENOUS
  Filled 2017-05-29: qty 1

## 2017-05-29 MED ORDER — DEXAMETHASONE SODIUM PHOSPHATE 10 MG/ML IJ SOLN
10.0000 mg | Freq: Once | INTRAMUSCULAR | Status: AC
  Administered 2017-05-29: 10 mg via INTRAVENOUS

## 2017-05-29 MED ORDER — METOCLOPRAMIDE HCL 5 MG/ML IJ SOLN
INTRAMUSCULAR | Status: AC
  Filled 2017-05-29: qty 2

## 2017-05-29 MED ORDER — DIPHENHYDRAMINE HCL 50 MG/ML IJ SOLN
25.0000 mg | Freq: Once | INTRAMUSCULAR | Status: AC
  Administered 2017-05-29: 25 mg via INTRAVENOUS

## 2017-05-29 NOTE — ED Provider Notes (Signed)
[facility] Emergency Department Provider Note    None    (approximate)  I have reviewed the triage vital signs and the nursing notes.   HISTORY  Chief Complaint Chest Pain and Headache    HPI Angel Brock is a 32 y.o. female with a history of bipolar 1 disorder as well as asthma and a history of migraine headaches presents with 3 days of midsternal nonradiating chest pain she describes as generalized aching sensation.  Is never had pain like this before.  States is constant but gets worse when she thinks about stressful things at work she is been working 2 jobs.  She is today also developed a migraine headache which is consistent with her previous.  Gradual in onset.  Does endorse photophobia and phonophobia with aura.  Denies any numbness or tingling.  No sudden onset.  This is not the worst headache of her life.  Denies any fevers at home.  She is not on oral contraceptive pills.  She does smoke occasionally.  Past Medical History:  Diagnosis Date  . Asthma   . Bilateral ovarian cysts   . Bipolar 1 disorder (HCC)   . Headache    History reviewed. No pertinent family history. Past Surgical History:  Procedure Laterality Date  . ANKLE ARTHROSCOPY Left   . APPENDECTOMY    . TONSILLECTOMY    . TUBAL LIGATION     There are no active problems to display for this patient.     Prior to Admission medications   Medication Sig Start Date End Date Taking? Authorizing Provider  acetaminophen (TYLENOL) 325 MG tablet Take 650 mg by mouth every 6 (six) hours as needed for mild pain, moderate pain or headache. For pain    [provider]  albuterol (PROVENTIL HFA;VENTOLIN HFA) 108 (90 BASE) MCG/ACT inhaler Inhale 2 puffs into the lungs every 6 (six) hours as needed for wheezing or shortness of breath.    [provider]  carisoprodol (SOMA) 350 MG tablet Take 1 tablet (350 mg total) by mouth 3 (three) times daily as needed for muscle spasms.  11/24/16 11/24/17  Little, Traci M, PA-C  cyclobenzaprine (FLEXERIL) 5 MG tablet Take 1 tablet (5 mg total) by mouth 3 (three) times daily as needed for muscle spasms. 05/15/17   Menshew, Charlesetta Ivory, PA-C  dicyclomine (BENTYL) 20 MG tablet Take 1 tablet (20 mg total) by mouth 3 (three) times daily as needed (abdominal pain). 04/25/17   Phineas Semen, MD  fluticasone (FLONASE) 50 MCG/ACT nasal spray Place 1 spray into both nostrils daily. 12/26/16 01/02/17  Nita Sickle, MD  ibuprofen (ADVIL,MOTRIN) 600 MG tablet Take 1 tablet (600 mg total) by mouth every 6 (six) hours as needed. 12/26/16   Nita Sickle, MD  LORazepam (ATIVAN) 0.5 MG tablet Take 1 tablet (0.5 mg total) by mouth every 8 (eight) hours as needed for anxiety. 06/27/16 06/27/17  [name], MD  metoCLOPramide (REGLAN) 10 MG tablet Take 1 tablet (10 mg total) by mouth every 8 (eight) hours as needed. 12/18/16   Rebecka Apley, MD  metoCLOPramide (REGLAN) 10 MG tablet Take 1 tablet (10 mg total) by mouth every 8 (eight) hours as needed for nausea or vomiting. 04/25/17 04/25/18  Phineas Semen, MD  nabumetone (RELAFEN) 750 MG tablet Take 1 tablet (750 mg total) by mouth 2 (two) times daily. 05/15/17   Menshew, Charlesetta Ivory, PA-C  ondansetron (ZOFRAN) 4 MG tablet Take 1-2 tabs by mouth every  8 hours as needed for nausea/vomiting 05/07/17   Loleta Rose, MD  predniSONE (STERAPRED UNI-PAK 21 TAB) 10 MG (21) TBPK tablet Take 6 tablets on day 1. Take 5 tablets on day 2. Take 4 tablets on day 3. Take 3 tablets on day 4. Take 2 tablets on day 5. Take 1 tablets on day 6. 11/24/16   Little, Traci M, PA-C  pseudoephedrine (SUDAFED) 30 MG tablet Take 1 tablet (30 mg total) by mouth every 6 (six) hours as needed for congestion. 12/26/16 12/26/17  Nita Sickle, MD  sucralfate (CARAFATE) 1 g tablet Take 1 tablet (1 g total) by mouth 2 (two) times daily. 12/18/16   Rebecka Apley, MD    Allergies 2,4-d dimethylamine (amisol); Geodon  [ziprasidone hcl]; Norco [hydrocodone-acetaminophen]; Tramadol; Penicillins; Zofran [ondansetron hcl]; and Rocephin [ceftriaxone]    Social History Social History   Tobacco Use  . Smoking status: Current Every Day Smoker    Packs/day: 0.50    Types: Cigarettes  . Smokeless tobacco: Never Used  Substance Use Topics  . Alcohol use: No    Comment: occasional  . Drug use: No    Review of Systems Patient denies headaches, rhinorrhea, blurry vision, numbness, shortness of breath, chest pain, edema, cough, abdominal pain, nausea, vomiting, diarrhea, dysuria, fevers, rashes or hallucinations unless otherwise stated above in HPI. ____________________________________________   PHYSICAL EXAM:  VITAL SIGNS: Vitals:   05/29/17 1314  BP: 118/66  Pulse: 86  Resp: 18  Temp: 98.1 F (36.7 C)  SpO2: 96%    Constitutional: Alert and oriented. Well appearing and in no acute distress. Eyes: Conjunctivae are normal.  Head: Atraumatic. Nose: No congestion/rhinnorhea. Mouth/Throat: Mucous membranes are moist.   Neck: No stridor. Painless ROM.  Cardiovascular: Normal rate, regular rhythm. Grossly normal heart sounds.  Good peripheral circulation. Respiratory: Normal respiratory effort.  No retractions. Lungs CTAB. Gastrointestinal: Soft and nontender. No distention. No abdominal bruits. No CVA tenderness. Genitourinary:  Musculoskeletal: No lower extremity tenderness nor edema.  No joint effusions. Neurologic:  Normal speech and language. No gross focal neurologic deficits are appreciated. No facial droop Skin:  Skin is warm, dry and intact. No rash noted. Psychiatric: Mood and affect are normal. Speech and behavior are normal.  ____________________________________________   LABS (all labs ordered are listed, but only abnormal results are displayed)  Results for orders placed or performed during the hospital encounter of 05/29/17 (from the past 24 hour(s))  Basic metabolic panel      Status: Abnormal   Collection Time: 05/29/17  1:21 PM  Result Value Ref Range   Sodium 138 135 - 145 mmol/L   Potassium 4.3 3.5 - 5.1 mmol/L   Chloride 108 101 - 111 mmol/L   CO2 20 (L) 22 - 32 mmol/L   Glucose, Bld 104 (H) 65 - 99 mg/dL   BUN 14 6 - 20 mg/dL   Creatinine, Ser 1.61 0.44 - 1.00 mg/dL   Calcium 8.7 (L) 8.9 - 10.3 mg/dL   GFR calc non Af Amer >60 >60 mL/min   GFR calc Af Amer >60 >60 mL/min   Anion gap 10 5 - 15  CBC     Status: None   Collection Time: 05/29/17  1:21 PM  Result Value Ref Range   WBC 5.1 3.6 - 11.0 K/uL   RBC 4.26 3.80 - 5.20 MIL/uL   Hemoglobin 13.7 12.0 - 16.0 g/dL   HCT 09.6 04.5 - 40.9 %   MCV 95.1 80.0 - 100.0 fL  MCH 32.3 26.0 - 34.0 pg   MCHC 34.0 32.0 - 36.0 g/dL   RDW 47.812.6 29.511.5 - 62.114.5 %   Platelets 208 150 - 440 K/uL  Troponin I     Status: None   Collection Time: 05/29/17  1:21 PM  Result Value Ref Range   Troponin I <0.03 <0.03 ng/mL   ____________________________________________  EKG My review and personal interpretation at Time: 13:18   Indication: chest pain  Rate: 80  Rhythm: sinus Axis: normal Other: normal intervals, no stemi ____________________________________________  RADIOLOGY  I personally reviewed all radiographic images ordered to evaluate for the above acute complaints and reviewed radiology reports and findings.  These findings were personally discussed with the patient.  Please see medical record for radiology report.  ____________________________________________   PROCEDURES  Procedure(s) performed:  Procedures    Critical Care performed: no ____________________________________________   INITIAL IMPRESSION / ASSESSMENT AND PLAN / ED COURSE  Pertinent labs & imaging results that were available during my care of the patient were reviewed by me and considered in my medical decision making (see chart for details).  DDX: ACS, PE, pericarditis, bronchitis, anxiety, migraine, tension, cluster  Angel  A Christell Brock is a 32 y.o. who presents to the ED with with Hx of migraines and bipolar disorder p/w CP and HA.  Patient is AFVSS in ED. Exam as above. Given current presentation have considered the above differential.  Patient is low risk by heart score of 1.  She is low risk by Wells criteria and is PERC negative.  No signs or symptoms to suggest dissection.  Not clinically consistent with bronchitis.  Do suspect stress-induced symptoms.  The headache this is not worst HA ever. Gradual onset. HA similar to previous episodes. Denies focal neurologic symptoms. Denies trauma. No fevers or neck pain. No vision loss. Afebrile in ED. VSS. Exam as above. No meningeal signs. No CN, motor, sensory or cerebellar deficits. Temporal arteries palpable and non-tender. Appears well and non-toxic.  Will provide IV fluids for hydration and IV medications for symptom control.  Likely tension, non-specific or possible migraine HA. Clinical picture is not consistent with ICH, SAH, SDH, EDH, TIA, or CVA. No concern for meningitis or encephalitis. No concern for GCA/Temporal arteritis.  Pain improved. Repeat neuro exam is again without focal deficit, nuchal rigidity or evidence of meningeal irritation.  Stable to D/C home, follow up with PCP or Neurology if persistent recurrent Has.  Have discussed with the patient and available family all diagnostics and treatments performed thus far and all questions were answered to the best of my ability. The patient demonstrates understanding and agreement with plan.        ____________________________________________   FINAL CLINICAL IMPRESSION(S) / ED DIAGNOSES  Final diagnoses:  Chest pain, unspecified type  Bad headache      NEW MEDICATIONS STARTED DURING THIS VISIT:  New Prescriptions   No medications on file     Note:  This document was prepared using Dragon voice recognition software and may include unintentional dictation errors.    Willy Eddyobinson, Kensley Lares, MD 05/29/17  [phone]

## 2017-05-29 NOTE — Discharge Instructions (Signed)

## 2017-05-29 NOTE — ED Triage Notes (Signed)
Pt to ED c/o chest pain that started a couple days ago to center chest like stabbing radiating to back and right shoulder and upper arm.  States nausea, SOB.  Denies cardiac hx.

## 2017-09-19 ENCOUNTER — Encounter (HOSPITAL_COMMUNITY): Payer: Self-pay | Admitting: Emergency Medicine

## 2017-09-19 ENCOUNTER — Emergency Department (HOSPITAL_COMMUNITY): Payer: Self-pay

## 2017-09-19 ENCOUNTER — Emergency Department (HOSPITAL_COMMUNITY)
Admission: EM | Admit: 2017-09-19 | Discharge: 2017-09-20 | Discharge status: Against Medical Advice | Payer: Self-pay | Attending: Emergency Medicine | Admitting: Emergency Medicine

## 2017-09-19 ENCOUNTER — Other Ambulatory Visit: Payer: Self-pay

## 2017-09-19 DIAGNOSIS — Z5321 Procedure and treatment not carried out due to patient leaving prior to being seen by health care provider: Secondary | ICD-10-CM | POA: Insufficient documentation

## 2017-09-19 DIAGNOSIS — R51 Headache: Secondary | ICD-10-CM | POA: Insufficient documentation

## 2017-09-19 LAB — DIFFERENTIAL
Abs Immature Granulocytes: 0 10*3/uL (ref 0.0–0.1)
Basophils Absolute: 0 10*3/uL (ref 0.0–0.1)
Basophils Relative: 1 %
Eosinophils Absolute: 0.2 10*3/uL (ref 0.0–0.7)
Eosinophils Relative: 3 %
Immature Granulocytes: 0 %
LYMPHS PCT: 34 %
Lymphs Abs: 2 10*3/uL (ref 0.7–4.0)
MONOS PCT: 8 %
Monocytes Absolute: 0.5 10*3/uL (ref 0.1–1.0)
NEUTROS ABS: 3.2 10*3/uL (ref 1.7–7.7)
Neutrophils Relative %: 54 %

## 2017-09-19 LAB — COMPREHENSIVE METABOLIC PANEL
ALBUMIN: 3.6 g/dL (ref 3.5–5.0)
ALK PHOS: 52 U/L (ref 38–126)
ALT: 14 U/L (ref 0–44)
ANION GAP: 9 (ref 5–15)
AST: 17 U/L (ref 15–41)
BILIRUBIN TOTAL: 0.5 mg/dL (ref 0.3–1.2)
BUN: 12 mg/dL (ref 6–20)
CALCIUM: 8.8 mg/dL — AB (ref 8.9–10.3)
CO2: 22 mmol/L (ref 22–32)
CREATININE: 0.94 mg/dL (ref 0.44–1.00)
Chloride: 110 mmol/L (ref 98–111)
GFR calc Af Amer: >60 mL/min (ref >60)
GFR calc non Af Amer: >60 mL/min (ref >60)
GLUCOSE: 91 mg/dL (ref 70–99)
Potassium: 3.6 mmol/L (ref 3.5–5.1)
Sodium: 141 mmol/L (ref 135–145)
Total Protein: 5.7 g/dL — ABNORMAL LOW (ref 6.5–8.1)

## 2017-09-19 LAB — CBC
HCT: 38.3 % (ref 36.0–46.0)
HEMOGLOBIN: 12.8 g/dL (ref 12.0–15.0)
MCH: 32.2 pg (ref 26.0–34.0)
MCHC: 33.4 g/dL (ref 30.0–36.0)
MCV: 96.5 fL (ref 78.0–100.0)
Platelets: 242 10*3/uL (ref 150–400)
RBC: 3.97 MIL/uL (ref 3.87–5.11)
RDW: 12.1 % (ref 11.5–15.5)
WBC: 5.9 10*3/uL (ref 4.0–10.5)

## 2017-09-19 LAB — I-STAT BETA HCG BLOOD, ED (MC, WL, AP ONLY)

## 2017-09-19 NOTE — ED Triage Notes (Addendum)
C/o "pounding" headache x 4 days with nausea and light sensitivity.  History of migraines.  Reports dizziness since waking up this morning and numbness to L side of face that started on the way to the hospital.  No facial droop.  No arm drift.  Took Ibuprofen, Tylenol, BC, and her mother's Fioricet without relief.

## 2017-09-19 NOTE — ED Notes (Signed)
Spoke with Idelle LeechKhatri, [place] at triage about pt.  Labs and CT ordered per PA.

## 2017-09-20 ENCOUNTER — Encounter (HOSPITAL_COMMUNITY): Payer: Self-pay | Admitting: Emergency Medicine

## 2017-09-20 ENCOUNTER — Emergency Department (HOSPITAL_COMMUNITY)
Admission: EM | Admit: 2017-09-20 | Discharge: 2017-09-20 | Disposition: A | Discharge status: Home / Self Care | Payer: Self-pay | Attending: Emergency Medicine | Admitting: Emergency Medicine

## 2017-09-20 DIAGNOSIS — R200 Anesthesia of skin: Secondary | ICD-10-CM | POA: Insufficient documentation

## 2017-09-20 DIAGNOSIS — R51 Headache: Secondary | ICD-10-CM | POA: Insufficient documentation

## 2017-09-20 DIAGNOSIS — F17210 Nicotine dependence, cigarettes, uncomplicated: Secondary | ICD-10-CM | POA: Insufficient documentation

## 2017-09-20 DIAGNOSIS — Z79899 Other long term (current) drug therapy: Secondary | ICD-10-CM | POA: Insufficient documentation

## 2017-09-20 DIAGNOSIS — R519 Headache, unspecified: Secondary | ICD-10-CM

## 2017-09-20 DIAGNOSIS — J45909 Unspecified asthma, uncomplicated: Secondary | ICD-10-CM | POA: Insufficient documentation

## 2017-09-20 LAB — CBC
HCT: 41.1 % (ref 36.0–46.0)
HEMOGLOBIN: 13.8 g/dL (ref 12.0–15.0)
MCH: 32.3 pg (ref 26.0–34.0)
MCHC: 33.6 g/dL (ref 30.0–36.0)
MCV: 96.3 fL (ref 78.0–100.0)
PLATELETS: 230 10*3/uL (ref 150–400)
RBC: 4.27 MIL/uL (ref 3.87–5.11)
RDW: 12 % (ref 11.5–15.5)
WBC: 4.4 10*3/uL (ref 4.0–10.5)

## 2017-09-20 LAB — BASIC METABOLIC PANEL
ANION GAP: 8 (ref 5–15)
BUN: 9 mg/dL (ref 6–20)
CHLORIDE: 108 mmol/L (ref 98–111)
CO2: 22 mmol/L (ref 22–32)
CREATININE: 0.79 mg/dL (ref 0.44–1.00)
Calcium: 8.9 mg/dL (ref 8.9–10.3)
GFR calc non Af Amer: >60 mL/min (ref >60)
Glucose, Bld: 80 mg/dL (ref 70–99)
Potassium: 3.9 mmol/L (ref 3.5–5.1)
SODIUM: 138 mmol/L (ref 135–145)

## 2017-09-20 MED ORDER — ACETAMINOPHEN 500 MG PO TABS: 1000.0000 mg | ORAL_TABLET | Freq: Once | ORAL | Status: DC

## 2017-09-20 MED ORDER — DIPHENHYDRAMINE HCL 50 MG/ML IJ SOLN
50.0000 mg | Freq: Once | INTRAMUSCULAR | Status: AC
  Administered 2017-09-20: 50 mg via INTRAVENOUS
  Filled 2017-09-20: qty 1

## 2017-09-20 MED ORDER — KETOROLAC TROMETHAMINE 30 MG/ML IJ SOLN
30.0000 mg | Freq: Once | INTRAMUSCULAR | Status: AC
  Administered 2017-09-20: 30 mg via INTRAVENOUS
  Filled 2017-09-20: qty 1

## 2017-09-20 MED ORDER — OXYCODONE-ACETAMINOPHEN 5-325 MG PO TABS
1.0000 | ORAL_TABLET | ORAL | Status: DC | PRN
  Administered 2017-09-20: 1 via ORAL
  Filled 2017-09-20: qty 1

## 2017-09-20 MED ORDER — DEXAMETHASONE SODIUM PHOSPHATE 10 MG/ML IJ SOLN
10.0000 mg | Freq: Once | INTRAMUSCULAR | Status: AC
  Administered 2017-09-20: 10 mg via INTRAVENOUS
  Filled 2017-09-20: qty 1

## 2017-09-20 MED ORDER — PROCHLORPERAZINE EDISYLATE 10 MG/2ML IJ SOLN
10.0000 mg | Freq: Once | INTRAMUSCULAR | Status: AC
  Administered 2017-09-20: 10 mg via INTRAVENOUS
  Filled 2017-09-20: qty 2

## 2017-09-20 MED ORDER — SODIUM CHLORIDE 0.9 % IV BOLUS
1000.0000 mL | Freq: Once | INTRAVENOUS | Status: AC
  Administered 2017-09-20: 1000 mL via INTRAVENOUS

## 2017-09-20 NOTE — ED Notes (Signed)
Came back in from outside and told registrations that she was leaving.  I was not at the desk at that time.

## 2017-09-20 NOTE — ED Notes (Signed)
Pt called with no response for vitals recheck.

## 2017-09-20 NOTE — ED Triage Notes (Addendum)
Pt states headache x5 days states she has a hx of migraines, took her normal meds but they have not helped, yesterday she began having left sided facial pain that progressed to facial numbness and tingling around 2000 last night. pts speech clear, face symmetrical, a/ox4, no drift, equal grip strength. Pt seen here for same symptoms last night, blood work and head CT done

## 2017-09-20 NOTE — ED Notes (Addendum)
At the desk inquiring about wait time.  Updated on wait.  Neuro check performed at this time.  Going outside at this time.

## 2017-09-20 NOTE — Discharge Instructions (Addendum)
Please see the information and instructions below regarding your visit.  Your diagnoses today include:  1. Bad headache     You were seen and treated in the emergency department today for headache. Fortunately, your vitals, exam, and work-up is reassuring with no apparent emergent cause for your headache at this time.  Tests performed today include: See side panel of your discharge paperwork for testing performed today. Vital signs are listed at the bottom of these instructions.   Medications prescribed:    Try to avoid daily or regular use of tylenol, aspirin, ibuprofen, and other overt-the-counter pain medications as this can contribute to rebound headaches.   Take any prescribed medications only as prescribed, and any over the counter medications only as directed on the packaging.  Home care instructions:   Drink plenty of fluids at home. This will help with your headache. Be cautious with caffeine use, as this can cause your headache to rebound when the effects wear off. If you drink more than 2 cups of coffee/caffeinated tea, or caffeinated soda per day, I suggest you wean down that amount.  Please follow any educational materials contained in this packet.   Please keep a log of your headache in order to identify any risk factors with food, sleep, or activity.  Follow-up instructions: Please follow-up with your primary care provider in 2 weeks for further evaluation of your symptoms if they are not completely improved.    Return instructions:  Please return to the Emergency Department if you experience worsening symptoms. It is VERY important that you monitor your symptoms at home. If you develop worsening headache, new fever, new neck stiffness, rash, focal weakness or numbness, or any other new or concerning symptoms, please return to the ED immediately, as these may be signs that your headache has become a potentially serious and life-threatening condition.  Please return if  you have any other emergent concerns.  Additional Information:   Your vital signs today were: BP 103/66    Pulse (!) 57    Temp 98.3 F (36.8 C) (Oral)    Resp (!) 22    LMP 09/17/2017    SpO2 100%  If your blood pressure (BP) was elevated on multiple readings during this visit above 130 for the top number or above 80 for the bottom number, please have this repeated by your primary care provider within one month. --------------  Thank you for allowing us to participate in your care today.

## 2017-09-20 NOTE — ED Notes (Signed)
Patient verbalizes understanding of discharge instructions. Opportunity for questioning and answers were provided. Armband removed by staff, pt discharged from ED ambulatory with daughter.    

## 2017-09-20 NOTE — ED Notes (Signed)
Pt states she is okay to take hydrocodone, has no adverse reactions to it

## 2017-09-20 NOTE — ED Provider Notes (Signed)
[facility] EMERGENCY DEPARTMENT Provider Note   CSN: [id number] Arrival date & time: 09/20/17  1053     History   Chief Complaint Chief Complaint  Patient presents with  . Headache  . Numbness    HPI FLONNIE WIERMAN is a 32 y.o. female.  HPI  Patient [age] female with a history of asthma, headaches, bipolar 1 disorder presenting for headache.  Patient reports that for 5 days, she has had a severe headache in a bandlike pattern all across her scalp.  Patient reports that the quality is consistent with her prior headaches, but it is more severe than her past headaches.  Patient reports that it started gradually 5 days ago, and did not have a thunderclap nature to it.  It started as all prior headaches per.  Patient denies any fever or neck pain when it started.  Patient does note that last night, she began having some decreased sensation to the left side of her face as well as some "tingling" in all fingers of her left hand.  No weakness of upper or lower tremors.  No visual disturbance.  No loss of consciousness.  Patient denies any hypercoagulable risk factors.  No family history of aneurysm.  Patient reports that she has tried multiple over-the-counter and prescription medications including Fioricet, BC powder, Tylenol, ibuprofen to relieve her headache without relief.  Past Medical History:  Diagnosis Date  . Asthma   . Bilateral ovarian cysts   . Bipolar 1 disorder (HCC)   . Headache     There are no active problems to display for this patient.   Past Surgical History:  Procedure Laterality Date  . ANKLE ARTHROSCOPY Left   . APPENDECTOMY    . TONSILLECTOMY    . TUBAL LIGATION       OB History    Gravida  4   Para  4   Term  3   Preterm  1   AB      Living  4     SAB      TAB      Ectopic      Multiple      Live Births  4            Home Medications    Prior to Admission medications   Medication Sig Start Date End Date  Taking? Authorizing Provider  acetaminophen (TYLENOL) 325 MG tablet Take 650 mg by mouth every 6 (six) hours as needed for mild pain, moderate pain or headache. For pain    [provider]  albuterol (PROVENTIL HFA;VENTOLIN HFA) 108 (90 BASE) MCG/ACT inhaler Inhale 2 puffs into the lungs every 6 (six) hours as needed for wheezing or shortness of breath.    [provider]  carisoprodol (SOMA) 350 MG tablet Take 1 tablet (350 mg total) by mouth 3 (three) times daily as needed for muscle spasms. 11/24/16 11/24/17  Little, Traci M, PA-C  cyclobenzaprine (FLEXERIL) 5 MG tablet Take 1 tablet (5 mg total) by mouth 3 (three) times daily as needed for muscle spasms. 05/15/17   Menshew, Charlesetta Ivory, PA-C  dicyclomine (BENTYL) 20 MG tablet Take 1 tablet (20 mg total) by mouth 3 (three) times daily as needed (abdominal pain). 04/25/17   Phineas Semen, MD  fluticasone (FLONASE) 50 MCG/ACT nasal spray Place 1 spray into both nostrils daily. 12/26/16 01/02/17  Nita Sickle, MD  ibuprofen (ADVIL,MOTRIN) 600 MG tablet Take 1 tablet (600 mg total) by mouth  every 6 (six) hours as needed. 12/26/16   Nita Sickle, MD  metoCLOPramide (REGLAN) 10 MG tablet Take 1 tablet (10 mg total) by mouth every 8 (eight) hours as needed. 12/18/16   Rebecka Apley, MD  metoCLOPramide (REGLAN) 10 MG tablet Take 1 tablet (10 mg total) by mouth every 8 (eight) hours as needed for nausea or vomiting. 04/25/17 04/25/18  Phineas Semen, MD  nabumetone (RELAFEN) 750 MG tablet Take 1 tablet (750 mg total) by mouth 2 (two) times daily. 05/15/17   Menshew, Charlesetta Ivory, PA-C  ondansetron (ZOFRAN) 4 MG tablet Take 1-2 tabs by mouth every 8 hours as needed for nausea/vomiting 05/07/17   Loleta Rose, MD  predniSONE (STERAPRED UNI-PAK 21 TAB) 10 MG (21) TBPK tablet Take 6 tablets on day 1. Take 5 tablets on day 2. Take 4 tablets on day 3. Take 3 tablets on day 4. Take 2 tablets on day 5. Take 1 tablets on day 6. 11/24/16    Little, Traci M, PA-C  pseudoephedrine (SUDAFED) 30 MG tablet Take 1 tablet (30 mg total) by mouth every 6 (six) hours as needed for congestion. 12/26/16 12/26/17  Nita Sickle, MD  sucralfate (CARAFATE) 1 g tablet Take 1 tablet (1 g total) by mouth 2 (two) times daily. 12/18/16   Rebecka Apley, MD    Family History No family history on file.  Social History Social History   Tobacco Use  . Smoking status: Current Every Day Smoker    Packs/day: 0.50    Types: Cigarettes  . Smokeless tobacco: Never Used  Substance Use Topics  . Alcohol use: No    Comment: occasional  . Drug use: No     Allergies   2,4-d dimethylamine (amisol); Geodon [ziprasidone hcl]; Norco [hydrocodone-acetaminophen]; Tramadol; Penicillins; Zofran [ondansetron hcl]; and Rocephin [ceftriaxone]   Review of Systems Review of Systems  Constitutional: Negative for chills and fever.  HENT: Positive for congestion.   Eyes: Positive for photophobia. Negative for visual disturbance.  Gastrointestinal: Positive for nausea. Negative for abdominal pain and vomiting.  Neurological: Positive for headaches. Negative for tremors, seizures, speech difficulty, weakness and numbness.  All other systems reviewed and are negative.    Physical Exam Updated Vital Signs BP 98/74 (BP Location: Right Arm)   Pulse 86   Temp 98.3 F (36.8 C) (Oral)   Resp 17   LMP 09/17/2017   SpO2 99%   Physical Exam  Constitutional: She appears well-developed and well-nourished. No distress.  HENT:  Head: Normocephalic and atraumatic.  Mouth/Throat: Oropharynx is clear and moist.  Eyes: Pupils are equal, round, and reactive to light. Conjunctivae and EOM are normal.  Neck: Normal range of motion. Neck supple.  Cardiovascular: Normal rate, regular rhythm, S1 normal and S2 normal.  No murmur heard. Pulmonary/Chest: Effort normal and breath sounds normal. She has no wheezes. She has no rales.  Abdominal: Soft. She exhibits no  distension. There is no tenderness. There is no guarding.  Musculoskeletal: Normal range of motion. She exhibits no edema or deformity.  Lymphadenopathy:    She has no cervical adenopathy.  Neurological: She is alert. GCS eye subscore is 4. GCS verbal subscore is 5. GCS motor subscore is 6.  Mental Status:  Alert, oriented, thought content appropriate, able to give a coherent history. Speech fluent without evidence of aphasia. Able to follow 2 step commands without difficulty.  Cranial Nerves:  II:  Peripheral visual fields grossly normal, pupils equal, round, reactive to light III,IV, VI: ptosis  not present, extra-ocular motions intact bilaterally  V,VII: smile symmetric, facial light touch sensation equal VIII: hearing grossly normal to voice  X: uvula elevates symmetrically  XI: bilateral shoulder shrug symmetric and strong XII: midline tongue extension without fassiculations Motor:  Normal tone. 5/5 in upper and lower extremities bilaterally including strong and equal grip strength and dorsiflexion/plantar flexion Sensory: Pinprick and light touch normal in all extremities.  Deep Tendon Reflexes: 2+ and symmetric in the biceps and patella. No clonus. Cerebellar: normal finger-to-nose with bilateral upper extremities Gait: normal gait and balance Stance: Romberg negative. No pronator drift and good coordination, strength, and position sense with tapping of bilateral arms (performed in sitting position). CV: distal pulses palpable throughout   Skin: Skin is warm and dry. No rash noted. No erythema.  Psychiatric: She has a normal mood and affect. Her behavior is normal. Judgment and thought content normal.  Nursing note and vitals reviewed.    ED Treatments / Results  Labs (all labs ordered are listed, but only abnormal results are displayed) Labs Reviewed  BASIC METABOLIC PANEL  CBC    EKG None  Radiology Ct Head Wo Contrast  Result Date: 09/19/2017 CLINICAL DATA:  Severe  headache for 4 days with nausea and light sensitivity. History of migraine headaches. No reported injury. EXAM: CT HEAD WITHOUT CONTRAST TECHNIQUE: Contiguous axial images were obtained from the base of the skull through the vertex without intravenous contrast. COMPARISON:  10/31/2014 head CT. 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 No evidence of parenchymal hemorrhage or extra-axial fluid collection. No new mass lesion, mass effect, or midline shift. No CT evidence of acute infarction. Cerebral volume is age appropriate. No ventriculomegaly. Vascular: No acute abnormality. Skull: No evidence of calvarial fracture. Sinuses/Orbits: The visualized paranasal sinuses are essentially clear. Other:  The mastoid air cells are unopacified. IMPRESSION: 1.  No evidence of acute intracranial abnormality. 2. 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 Electronically Signed   By: Delbert Phenix M.D.   On: 09/19/2017 23:27    Procedures Procedures (including critical care time)  Medications Ordered in ED Medications  oxyCODONE-acetaminophen (PERCOCET/ROXICET) 5-325 MG per tablet 1 tablet (1 tablet Oral Given 09/20/17 1114)  sodium chloride 0.9 % bolus 1,000 mL (has no administration in time range)  dexamethasone (DECADRON) injection 10 mg (has no administration in time range)  prochlorperazine (COMPAZINE) injection 10 mg (has no administration in time range)  diphenhydrAMINE (BENADRYL) injection 50 mg (has no administration in time range)  acetaminophen (TYLENOL) tablet 1,000 mg (has no administration in time range)     Initial Impression / Assessment and Plan / ED Course  I have reviewed the triage vital signs and the nursing notes.  Pertinent labs & imaging results that were available during my care of the patient were reviewed by me and considered in my medical decision making (see chart for details).     Patient without high-risk  features of headache including: sudden onset/thunderclap HA, no similar headache in past, altered mental status, accompanying seizure, headache with exertion, age > 65, history of immunocompromise, neck or shoulder pain, fever, use of anticoagulation, family history of spontaneous SAH, hypercoagulable risk factors, concomitant drug use, toxic exposure.   Patient has a normal complete neurological exam, normal vital signs, normal level of consciousness, no signs of meningismus, is well-appearing/non-toxic appearing, no signs of trauma, and no pain over temporal arteries.   CT scan performed yesterday  when patient left without being seen demonstrated stable pineal cyst from 2011 but no other acute abnormality.  Given patient's intact neurologic status today, and clinical presentation consistent with complaint of migraine, feel that MRI unnecessary today.  No dangerous or life-threatening conditions suspected or identified by history, physical exam, and by work-up. No indications for hospitalization identified.   Return precautions were given for any persistent neurologic deficits, visual disturbance, fever, chills, or neck pain with HA, or thunderclap quality to HA.   This is a shared visit with Dr. Bethann BerkshireJoseph Zammit. Patient was independently evaluated by this attending physician. Attending physician consulted in evaluation and discharge management.  Final Clinical Impressions(s) / ED Diagnoses   Final diagnoses:  Bad headache    ED Discharge Orders    None       Delia ChimesMurray, Morning Halberg B, PA-C 09/20/17 1631    Bethann BerkshireZammit, Joseph, MD 09/20/17 2018

## 2017-10-02 ENCOUNTER — Emergency Department: Payer: Self-pay

## 2017-10-02 ENCOUNTER — Other Ambulatory Visit: Payer: Self-pay

## 2017-10-02 ENCOUNTER — Emergency Department
Admission: EM | Admit: 2017-10-02 | Discharge: 2017-10-02 | Disposition: A | Discharge status: Home / Self Care | Payer: Self-pay | Attending: Emergency Medicine | Admitting: Emergency Medicine

## 2017-10-02 ENCOUNTER — Encounter: Payer: Self-pay | Admitting: Emergency Medicine

## 2017-10-02 DIAGNOSIS — J45909 Unspecified asthma, uncomplicated: Secondary | ICD-10-CM | POA: Insufficient documentation

## 2017-10-02 DIAGNOSIS — M545 Low back pain: Secondary | ICD-10-CM | POA: Insufficient documentation

## 2017-10-02 DIAGNOSIS — W19XXXA Unspecified fall, initial encounter: Secondary | ICD-10-CM

## 2017-10-02 DIAGNOSIS — M25551 Pain in right hip: Secondary | ICD-10-CM | POA: Insufficient documentation

## 2017-10-02 DIAGNOSIS — F17210 Nicotine dependence, cigarettes, uncomplicated: Secondary | ICD-10-CM | POA: Insufficient documentation

## 2017-10-02 LAB — POCT PREGNANCY, URINE: PREG TEST UR: NEGATIVE

## 2017-10-02 MED ORDER — KETOROLAC TROMETHAMINE 30 MG/ML IJ SOLN
30.0000 mg | Freq: Once | INTRAMUSCULAR | Status: AC
Start: 2017-10-02 — End: 2017-10-02
  Administered 2017-10-02: 30 mg via INTRAMUSCULAR
  Filled 2017-10-02: qty 1

## 2017-10-02 MED ORDER — MELOXICAM 15 MG PO TABS: 15.0000 mg | ORAL_TABLET | Freq: Every day | ORAL | 1 refills | Status: AC

## 2017-10-02 MED ORDER — METHOCARBAMOL 500 MG PO TABS: 500.0000 mg | ORAL_TABLET | Freq: Three times a day (TID) | ORAL | 0 refills | Status: AC | PRN

## 2017-10-02 NOTE — ED Notes (Signed)
Pt states fell at work - does not want to file worker's comp.

## 2017-10-02 NOTE — ED Triage Notes (Signed)
Pt presents to ED via POV with c/o R hip pain. Pt states was at work when she slipped and landed on R hip, states fall happened yesterday. Pt states pain radiates from R hip down her leg, pt states initially ambulatory after the fall. Pt states she does not wish to file worker comp at this time.

## 2017-10-02 NOTE — ED Provider Notes (Signed)
[facility] Emergency Department Provider Note  ____________________________________________  Time seen: Approximately 3:50 PM  I have reviewed the triage vital signs and the nursing notes.   HISTORY  Chief Complaint Fall    HPI Angel Brock is a 32 y.o. female presents to the emergency department with 9 out of 10 aching right hip pain after patient reports that she slipped at her place of work, [employer] yesterday.  Patient reports that she did not hit her head and is not experiencing neck pain since the fall.  Patient does have some mild low back pain in association with hip pain.  She has been ambulating since incident and continued to work throughout her shift.  She denies radiculopathy or weakness.  No bowel or bladder incontinence.  Patient has been taking Tylenol for pain.   Past Medical History:  Diagnosis Date  . Asthma   . Bilateral ovarian cysts   . Bipolar 1 disorder (HCC)   . Headache     There are no active problems to display for this patient.   Past Surgical History:  Procedure Laterality Date  . ANKLE ARTHROSCOPY Left   . APPENDECTOMY    . TONSILLECTOMY    . TUBAL LIGATION      Prior to Admission medications   Medication Sig Start Date End Date Taking? Authorizing Provider  acetaminophen (TYLENOL) 325 MG tablet Take 650 mg by mouth every 6 (six) hours as needed for mild pain, moderate pain or headache. For pain    [provider]  meloxicam (MOBIC) 15 MG tablet Take 1 tablet (15 mg total) by mouth daily for 7 days. 10/02/17 10/09/17  Orvil FeilWoods, Jaclyn M, PA-C  methocarbamol (ROBAXIN) 500 MG tablet Take 1 tablet (500 mg total) by mouth 3 (three) times daily as needed for up to 5 days for muscle spasms. 10/02/17 10/07/17  Orvil FeilWoods, Jaclyn M, PA-C    Allergies 2,4-d dimethylamine (amisol); Geodon [ziprasidone hcl]; Norco [hydrocodone-acetaminophen]; Tramadol; Penicillins; Zofran [ondansetron hcl]; and Rocephin [ceftriaxone]  No  family history on file.  Social History Social History   Tobacco Use  . Smoking status: Current Every Day Smoker    Packs/day: 0.50    Types: Cigarettes  . Smokeless tobacco: Never Used  Substance Use Topics  . Alcohol use: No    Comment: occasional  . Drug use: No     Review of Systems  Constitutional: No fever/chills Eyes: No visual changes. No discharge ENT: No upper respiratory complaints. Cardiovascular: no chest pain. Respiratory: no cough. No SOB. Gastrointestinal: No abdominal pain.  No nausea, no vomiting.  No diarrhea.  No constipation. Genitourinary: Negative for dysuria. No hematuria Musculoskeletal: Patient has right hip pain.  Skin: Negative for rash, abrasions, lacerations, ecchymosis. Neurological: Negative for headaches, focal weakness or numbness.   ____________________________________________   PHYSICAL EXAM:  VITAL SIGNS: ED Triage Vitals [10/02/17 1500]  Enc Vitals Group     BP 134/77     Pulse Rate 98     Resp 18     Temp 98.7 F (37.1 C)     Temp Source Oral     SpO2 98 %     Weight 210 lb (95.3 kg)     Height 5\' 1"  (1.549 m)     Head Circumference      Peak Flow      Pain Score 9     Pain Loc      Pain Edu?      Excl. in GC?  Constitutional: Alert and oriented. Well appearing and in no acute distress. Eyes: Conjunctivae are normal. PERRL. EOMI. Head: Atraumatic. Neck: No stridor.  No cervical spine tenderness to palpation. Cardiovascular: Normal rate, regular rhythm. Normal S1 and S2.  Good peripheral circulation. Respiratory: Normal respiratory effort without tachypnea or retractions. Lungs CTAB. Good air entry to the bases with no decreased or absent breath sounds. Musculoskeletal: Patient has right lateral hip pain to palpation but no significant pain with internal and external rotation of the right hip.  Negative straight leg raise, right. Neurologic:  Normal speech and language. No gross focal neurologic deficits are  appreciated.  Skin:  Skin is warm, dry and intact. No rash noted. Psychiatric: Mood and affect are normal. Speech and behavior are normal. Patient exhibits appropriate insight and judgement.   ____________________________________________   LABS (all labs ordered are listed, but only abnormal results are displayed)  Labs Reviewed  POCT PREGNANCY, URINE  POC URINE PREG, ED   ____________________________________________  EKG   ____________________________________________  RADIOLOGY I personally viewed and evaluated these images as part of my medical decision making, as well as reviewing the written report by the radiologist.    Dg Lumbar Spine 2-3 Views  Result Date: 10/02/2017 CLINICAL DATA:  Patient fell last evening landing on right hip. Right lower back pain radiating to the right hip and thigh. EXAM: LUMBAR SPINE - 2-3 VIEW COMPARISON:  CT 12/18/2016 reformats FINDINGS: There is no evidence of lumbar spine fracture. Alignment is normal. Intervertebral disc spaces are maintained. IMPRESSION: Negative. Electronically Signed   By: Tollie Eth M.D.   On: 10/02/2017 16:17   Dg Hip Unilat  With Pelvis 2-3 Views Right  Result Date: 10/02/2017 CLINICAL DATA:  Right low back pain radiating to the hip and thigh after fall at work last night. EXAM: DG HIP (WITH OR WITHOUT PELVIS) 2-3V RIGHT COMPARISON:  CT abdomen pelvis dated December 18, 2016. FINDINGS: There is no evidence of hip fracture or dislocation. There is no evidence of arthropathy or other focal bone abnormality. IMPRESSION: Negative. Electronically Signed   By: Obie Dredge M.D.   On: 10/02/2017 16:06    ____________________________________________    PROCEDURES  Procedure(s) performed:    Procedures    Medications  ketorolac (TORADOL) 30 MG/ML injection 30 mg (30 mg Intramuscular Given 10/02/17 1609)     ____________________________________________   INITIAL IMPRESSION / ASSESSMENT AND PLAN / ED  COURSE  Pertinent labs & imaging results that were available during my care of the patient were reviewed by me and considered in my medical decision making (see chart for details).  Review of the [place] CSRS was performed in accordance of the [facility] prior to dispensing any controlled drugs.      Assessment and plan Fall Patient presents to the emergency department with right lateral hip pain after a fall that occurred 1 day ago.  X-ray examination was reassuring in the emergency department and noncontributory for acute fractures or bony abnormalities.  Patient was given an injection of Toradol in the emergency department.  She was discharged with meloxicam and Robaxin.  A referral was given to orthopedics.  All patient questions were answered.     ____________________________________________  FINAL CLINICAL IMPRESSION(S) / ED DIAGNOSES  Final diagnoses:  Fall, initial encounter      NEW MEDICATIONS STARTED DURING THIS VISIT:  ED Discharge Orders        Ordered    meloxicam (MOBIC) 15 MG tablet  Daily     10/02/17  1640    methocarbamol (ROBAXIN) 500 MG tablet  3 times daily PRN     10/02/17 1640          This chart was dictated using voice recognition software/Dragon. Despite best efforts to proofread, errors can occur which can change the meaning. Any change was purely unintentional.    Orvil Feil, PA-C 10/02/17 1649    Rockne Menghini, MD 10/09/17 1434

## 2017-10-23 ENCOUNTER — Emergency Department: Payer: Self-pay

## 2017-10-23 ENCOUNTER — Encounter: Payer: Self-pay | Admitting: Emergency Medicine

## 2017-10-23 ENCOUNTER — Other Ambulatory Visit: Payer: Self-pay

## 2017-10-23 ENCOUNTER — Emergency Department
Admission: EM | Admit: 2017-10-23 | Discharge: 2017-10-23 | Disposition: A | Discharge status: Home / Self Care | Payer: Self-pay | Attending: Emergency Medicine | Admitting: Emergency Medicine

## 2017-10-23 DIAGNOSIS — J069 Acute upper respiratory infection, unspecified: Secondary | ICD-10-CM

## 2017-10-23 DIAGNOSIS — J45909 Unspecified asthma, uncomplicated: Secondary | ICD-10-CM | POA: Insufficient documentation

## 2017-10-23 DIAGNOSIS — F17210 Nicotine dependence, cigarettes, uncomplicated: Secondary | ICD-10-CM | POA: Insufficient documentation

## 2017-10-23 DIAGNOSIS — B9789 Other viral agents as the cause of diseases classified elsewhere: Secondary | ICD-10-CM

## 2017-10-23 DIAGNOSIS — J029 Acute pharyngitis, unspecified: Secondary | ICD-10-CM

## 2017-10-23 LAB — URINALYSIS, COMPLETE (UACMP) WITH MICROSCOPIC
BILIRUBIN URINE: NEGATIVE
Bacteria, UA: NONE SEEN
GLUCOSE, UA: NEGATIVE mg/dL
HGB URINE DIPSTICK: NEGATIVE
KETONES UR: NEGATIVE mg/dL
LEUKOCYTES UA: NEGATIVE
NITRITE: NEGATIVE
PH: 5 (ref 5.0–8.0)
Protein, ur: NEGATIVE mg/dL
Specific Gravity, Urine: 1.018 (ref 1.005–1.030)

## 2017-10-23 LAB — COMPREHENSIVE METABOLIC PANEL
ALK PHOS: 72 U/L (ref 38–126)
ALT: 12 U/L (ref 0–44)
AST: 17 U/L (ref 15–41)
Albumin: 3.5 g/dL (ref 3.5–5.0)
Anion gap: 7 (ref 5–15)
BUN: 10 mg/dL (ref 6–20)
CALCIUM: 8.8 mg/dL — AB (ref 8.9–10.3)
CO2: 23 mmol/L (ref 22–32)
CREATININE: 0.69 mg/dL (ref 0.44–1.00)
Chloride: 110 mmol/L (ref 98–111)
GFR calc Af Amer: >60 mL/min (ref >60)
Glucose, Bld: 113 mg/dL — ABNORMAL HIGH (ref 70–99)
Potassium: 3.5 mmol/L (ref 3.5–5.1)
Sodium: 140 mmol/L (ref 135–145)
Total Bilirubin: 0.2 mg/dL — ABNORMAL LOW (ref 0.3–1.2)
Total Protein: 6.4 g/dL — ABNORMAL LOW (ref 6.5–8.1)

## 2017-10-23 LAB — LIPASE, BLOOD: LIPASE: 50 U/L (ref 11–51)

## 2017-10-23 LAB — CBC
HCT: 38.4 % (ref 35.0–47.0)
Hemoglobin: 13.6 g/dL (ref 12.0–16.0)
MCH: 33.2 pg (ref 26.0–34.0)
MCHC: 35.3 g/dL (ref 32.0–36.0)
MCV: 93.8 fL (ref 80.0–100.0)
PLATELETS: 255 10*3/uL (ref 150–440)
RBC: 4.09 MIL/uL (ref 3.80–5.20)
RDW: 12.1 % (ref 11.5–14.5)
WBC: 8.9 10*3/uL (ref 3.6–11.0)

## 2017-10-23 LAB — GROUP A STREP BY PCR: Group A Strep by PCR: NOT DETECTED

## 2017-10-23 LAB — POCT PREGNANCY, URINE: Preg Test, Ur: NEGATIVE

## 2017-10-23 MED ORDER — DEXAMETHASONE SODIUM PHOSPHATE 10 MG/ML IJ SOLN
INTRAMUSCULAR | Status: AC
  Filled 2017-10-23: qty 1

## 2017-10-23 MED ORDER — BENZONATATE 100 MG PO CAPS: 100.0000 mg | ORAL_CAPSULE | Freq: Three times a day (TID) | ORAL | 0 refills | Status: DC | PRN

## 2017-10-23 MED ORDER — DEXAMETHASONE 10 MG/ML FOR PEDIATRIC ORAL USE
10.0000 mg | Freq: Once | INTRAMUSCULAR | Status: AC
Start: 2017-10-23 — End: 2017-10-23
  Administered 2017-10-23: 10 mg via ORAL
  Filled 2017-10-23: qty 1

## 2017-10-23 MED ORDER — HYDROCODONE-HOMATROPINE 5-1.5 MG/5ML PO SYRP
5.0000 mL | ORAL_SOLUTION | Freq: Four times a day (QID) | ORAL | 0 refills | Status: DC | PRN
Start: 2017-10-23 — End: 2018-04-22

## 2017-10-23 MED ORDER — HYDROCODONE-ACETAMINOPHEN 5-325 MG PO TABS
2.0000 | ORAL_TABLET | Freq: Once | ORAL | Status: AC
  Administered 2017-10-23: 2 via ORAL
  Filled 2017-10-23: qty 2

## 2017-10-23 MED ORDER — ONDANSETRON 4 MG PO TBDP
4.0000 mg | ORAL_TABLET | Freq: Once | ORAL | Status: AC
  Administered 2017-10-23: 4 mg via ORAL
  Filled 2017-10-23: qty 1

## 2017-10-23 NOTE — Discharge Instructions (Signed)

## 2017-10-23 NOTE — ED Triage Notes (Signed)
Patient to ER for N/V/D with productive cough and sore throat x6 days. Patient reports fever as high as 102 a couple of days ago. Took Tylenol at approx 2100 tonight. Patient reports sputum is brown-yellow. Patient reports vomiting and diarrhea are 1-2 times per day each.

## 2017-10-23 NOTE — ED Notes (Signed)
POC Pregnancy  test negative.

## 2017-10-23 NOTE — ED Notes (Signed)
Topez not working 

## 2017-10-23 NOTE — ED Provider Notes (Signed)
[facility] Emergency Department Provider Note  ____________________________________________   First MD Initiated Contact with Patient 10/23/17 0413     (approximate)  I have reviewed the triage vital signs and the nursing notes.   HISTORY  Chief Complaint Sore Throat and Cough    HPI Angel Brock is a 32 y.o. female with no contributory past medical history who presents with gradually worsening respiratory symptoms over the last 6 days including severe cough occasionally productive of white sputum, sore throat, nasal congestion, runny nose, general malaise, and body aches.  She reports that started all in her head and face with nasal congestion is gradually gotten worse.  Nothing makes it better and exertion makes it worse.  She does not really feel short of breath but the coughing makes it difficult for her to breathe.  She has been using her inhalers at home that she uses for asthma but they are not really helping.  She has not had any fever or chills of which she is aware.  Her chest hurts because of the coughing and her upper abdomen hurts because of the coughing but she is not otherwise having chest pain or abdominal pain.  Some nausea, no vomiting.  No dysuria.  Past Medical History:  Diagnosis Date  . Asthma   . Bilateral ovarian cysts   . Bipolar 1 disorder (HCC)   . Headache     There are no active problems to display for this patient.   Past Surgical History:  Procedure Laterality Date  . ANKLE ARTHROSCOPY Left   . APPENDECTOMY    . TONSILLECTOMY    . TUBAL LIGATION      Prior to Admission medications   Medication Sig Start Date End Date Taking? Authorizing Provider  acetaminophen (TYLENOL) 325 MG tablet Take 650 mg by mouth every 6 (six) hours as needed for mild pain, moderate pain or headache. For pain    [provider]  benzonatate (TESSALON PERLES) 100 MG capsule Take 1 capsule (100 mg total) by mouth 3 (three) times  daily as needed for cough. 10/23/17   Loleta Rose, MD  HYDROcodone-homatropine [facility]) 5-1.5 MG/5ML syrup Take 5 mLs by mouth every 6 (six) hours as needed for cough. 10/23/17   Loleta Rose, MD    Allergies 2,4-d dimethylamine (amisol); Geodon [ziprasidone hcl]; Norco [hydrocodone-acetaminophen]; Tramadol; Penicillins; Zofran [ondansetron hcl]; and Rocephin [ceftriaxone]  No family history on file.  Social History Social History   Tobacco Use  . Smoking status: Current Every Day Smoker    Packs/day: 0.50    Types: Cigarettes  . Smokeless tobacco: Never Used  Substance Use Topics  . Alcohol use: No    Comment: occasional  . Drug use: No    Review of Systems Constitutional: No fever/chills.  General malaise. Eyes: No visual changes. ENT: Sore throat and nasal congestion Cardiovascular: Chest pain from coughing Respiratory: Denies shortness of breath. Gastrointestinal: No abdominal pain.  Nausea, no vomiting.  No diarrhea.  No constipation. Genitourinary: Negative for dysuria. Musculoskeletal: Negative for neck pain.  Negative for back pain. Integumentary: Negative for rash. Neurological: Negative for headaches, focal weakness or numbness.   ____________________________________________   PHYSICAL EXAM:  VITAL SIGNS: ED Triage Vitals  Enc Vitals Group     BP 10/23/17 0117 112/65     Pulse Rate 10/23/17 0117 87     Resp 10/23/17 0117 20     Temp 10/23/17 0117 98.9 F (37.2 C)     Temp Source  10/23/17 0117 Oral     SpO2 10/23/17 0117 97 %     Weight 10/23/17 0118 95.3 kg (210 lb)     Height 10/23/17 0118 1.549 m (5\' 1" )     Head Circumference --      Peak Flow --      Pain Score 10/23/17 0117 8     Pain Loc --      Pain Edu? --      Excl. in GC? --     Constitutional: Alert and oriented.  Nontoxic appearance but does appear very uncomfortable with obvious viral respiratory symptoms Eyes: Conjunctivae are normal.  Head: Atraumatic. Ears:  Healthy appearing  ear canals and TMs bilaterally Nose: Copious congestion/rhinnorhea. Mouth/Throat: Mucous membranes are moist.  Oropharynx non-erythematous.  No exudate or petechiae on the palate Neck: No stridor.  No meningeal signs.   Cardiovascular: Normal rate, regular rhythm. Good peripheral circulation. Grossly normal heart sounds. Respiratory: Normal respiratory effort.  No retractions. Lungs CTAB.  Frequent and severe cough but otherwise clear lungs. Gastrointestinal: Soft and nontender. No distention.  Musculoskeletal: No lower extremity tenderness nor edema. No gross deformities of extremities. Neurologic:  Normal speech and language. No gross focal neurologic deficits are appreciated.  Skin:  Skin is warm, dry and intact. No rash noted. Psychiatric: Mood and affect are normal. Speech and behavior are normal.  ____________________________________________   LABS (all labs ordered are listed, but only abnormal results are displayed)  Labs Reviewed  COMPREHENSIVE METABOLIC PANEL - Abnormal; Notable for the following components:      Result Value   Glucose, Bld 113 (*)    Calcium 8.8 (*)    Total Protein 6.4 (*)    Total Bilirubin 0.2 (*)    All other components within normal limits  URINALYSIS, COMPLETE (UACMP) WITH MICROSCOPIC - Abnormal; Notable for the following components:   Color, Urine YELLOW (*)    APPearance HAZY (*)    All other components within normal limits  GROUP A STREP BY PCR  LIPASE, BLOOD  CBC  POC URINE PREG, ED   ____________________________________________  EKG  None - EKG not ordered by ED physician ____________________________________________  RADIOLOGY Marylou Mccoy, personally viewed and evaluated these images (plain radiographs) as part of my medical decision making, as well as reviewing the written report by the radiologist.  ED MD interpretation: No evidence of pneumonia  Official radiology report(s): Dg Chest 2 View  Result Date: 10/23/2017 CLINICAL  DATA:  Cough, sore throat, fever EXAM: CHEST - 2 VIEW COMPARISON:  05/29/2017 FINDINGS: Lungs are clear.  No pleural effusion or pneumothorax. The heart is normal in size. Visualized osseous structures are within normal limits. IMPRESSION: Normal chest radiographs. Electronically Signed   By: Charline Bills M.D.   On: 10/23/2017 01:58    ____________________________________________   PROCEDURES  Critical Care performed: No   Procedure(s) performed:   Procedures   ____________________________________________   INITIAL IMPRESSION / ASSESSMENT AND PLAN / ED COURSE  As part of my medical decision making, I reviewed the following data within the electronic MEDICAL RECORD NUMBER Nursing notes reviewed and incorporated, Labs reviewed , Radiograph reviewed  and Notes from prior ED visits    Differential diagnosis includes, but is not limited to, viral respiratory infection, pneumonia, bronchitis, sinusitis, influenza or influenza-like illness.  The patient's chest x-ray is clear with no evidence of pneumonia.  Vital signs are stable.  Lab work is all within normal limits including no leukocytosis, negative strep, negative UA,  normal conference of metabolic panel.  She is very uncomfortable due to the viral symptoms but without any indication for antibiotics.  I am treating her with Norco 2 tablets by mouth to help with cough suppression as well as pain in the acute setting.  I am giving her Decadron 10 mg by mouth to help with her pharyngitis as well as with the cough.  I am giving her prescription for cough syrup with hydrocodone and Tessalon.  I encourage close outpatient follow-up and gave strict return precautions.  She and her mother understand and agree with the plan.     ____________________________________________  FINAL CLINICAL IMPRESSION(S) / ED DIAGNOSES  Final diagnoses:  Viral URI with cough  Acute pharyngitis, unspecified etiology     MEDICATIONS GIVEN DURING THIS  VISIT:  Medications  dexamethasone (DECADRON) 10 MG/ML injection for Pediatric ORAL use 10 mg (10 mg Oral Given 10/23/17 0457)  HYDROcodone-acetaminophen (NORCO/VICODIN) 5-325 MG per tablet 2 tablet (2 tablets Oral Given 10/23/17 0450)  ondansetron (ZOFRAN-ODT) disintegrating tablet 4 mg (4 mg Oral Given 10/23/17 0450)     ED Discharge Orders        Ordered    HYDROcodone-homatropine (HYCODAN) 5-1.5 MG/5ML syrup  Every 6 hours PRN     10/23/17 0514    benzonatate (TESSALON PERLES) 100 MG capsule  3 times daily PRN     10/23/17 0514       Note:  This document was prepared using Dragon voice recognition software and may include unintentional dictation errors.    Loleta RoseForbach, Prachi Oftedahl, MD 10/23/17 [id number]

## 2017-11-18 ENCOUNTER — Emergency Department
Admission: EM | Admit: 2017-11-18 | Discharge: 2017-11-18 | Disposition: A | Discharge status: Home / Self Care | Payer: Self-pay | Attending: Emergency Medicine | Admitting: Emergency Medicine

## 2017-11-18 ENCOUNTER — Encounter: Payer: Self-pay | Admitting: Emergency Medicine

## 2017-11-18 ENCOUNTER — Emergency Department: Payer: Self-pay

## 2017-11-18 DIAGNOSIS — M436 Torticollis: Secondary | ICD-10-CM | POA: Insufficient documentation

## 2017-11-18 DIAGNOSIS — J45909 Unspecified asthma, uncomplicated: Secondary | ICD-10-CM | POA: Insufficient documentation

## 2017-11-18 DIAGNOSIS — F17210 Nicotine dependence, cigarettes, uncomplicated: Secondary | ICD-10-CM | POA: Insufficient documentation

## 2017-11-18 MED ORDER — HYDROMORPHONE HCL 1 MG/ML IJ SOLN
1.0000 mg | Freq: Once | INTRAMUSCULAR | Status: AC
  Administered 2017-11-18: 1 mg via INTRAMUSCULAR
  Filled 2017-11-18: qty 1

## 2017-11-18 MED ORDER — METHOCARBAMOL 750 MG PO TABS: 750.0000 mg | ORAL_TABLET | Freq: Four times a day (QID) | ORAL | 0 refills | Status: DC

## 2017-11-18 MED ORDER — ORPHENADRINE CITRATE 30 MG/ML IJ SOLN
60.0000 mg | Freq: Two times a day (BID) | INTRAMUSCULAR | Status: DC
  Administered 2017-11-18: 60 mg via INTRAMUSCULAR
  Filled 2017-11-18: qty 2

## 2017-11-18 MED ORDER — OXYCODONE-ACETAMINOPHEN 7.5-325 MG PO TABS: 1.0000 | ORAL_TABLET | Freq: Four times a day (QID) | ORAL | 0 refills | Status: DC | PRN
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
## 2017-11-18 NOTE — ED Triage Notes (Signed)
Pt in via EMS with c/o nontraumatic back pain that started a couple of days ago and now radiates into her neck. No meds, No hx, 100/65BP

## 2017-11-18 NOTE — ED Notes (Signed)
See triage note  Presents to ED with neck and left upper back/posterior shoulderer pain  States pain started this am after waking up   Pt is very tearful  Has 2 lidocaine in place  But area is very tender to touch

## 2017-11-18 NOTE — ED Triage Notes (Signed)
Pt reports upper back pain for a few days and ow the pain is severe and it hurts her neck and right shoulder blade. Pt reports took some muscle relaxers with no relief and is also wearing icy hot pain patches. Denies obvious injuries, SOB, or other sx's.

## 2017-11-22 ENCOUNTER — Encounter: Payer: Self-pay | Admitting: Emergency Medicine

## 2017-11-22 ENCOUNTER — Other Ambulatory Visit: Payer: Self-pay

## 2017-11-22 ENCOUNTER — Emergency Department
Admission: EM | Admit: 2017-11-22 | Discharge: 2017-11-22 | Disposition: A | Discharge status: Home / Self Care | Payer: No Typology Code available for payment source | Attending: Emergency Medicine | Admitting: Emergency Medicine

## 2017-11-22 ENCOUNTER — Emergency Department: Payer: No Typology Code available for payment source

## 2017-11-22 DIAGNOSIS — Y998 Other external cause status: Secondary | ICD-10-CM | POA: Insufficient documentation

## 2017-11-22 DIAGNOSIS — M542 Cervicalgia: Secondary | ICD-10-CM | POA: Diagnosis present

## 2017-11-22 DIAGNOSIS — J45909 Unspecified asthma, uncomplicated: Secondary | ICD-10-CM | POA: Insufficient documentation

## 2017-11-22 DIAGNOSIS — Y92410 Unspecified street and highway as the place of occurrence of the external cause: Secondary | ICD-10-CM | POA: Diagnosis not present

## 2017-11-22 DIAGNOSIS — Z87891 Personal history of nicotine dependence: Secondary | ICD-10-CM | POA: Insufficient documentation

## 2017-11-22 DIAGNOSIS — Y939 Activity, unspecified: Secondary | ICD-10-CM | POA: Insufficient documentation

## 2017-11-22 MED ORDER — PREDNISONE 50 MG PO TABS: ORAL_TABLET | ORAL | 0 refills | Status: DC

## 2017-11-22 MED ORDER — KETOROLAC TROMETHAMINE 30 MG/ML IJ SOLN
30.0000 mg | Freq: Once | INTRAMUSCULAR | Status: AC
  Administered 2017-11-22: 30 mg via INTRAMUSCULAR
  Filled 2017-11-22: qty 1

## 2017-11-22 MED ORDER — DIAZEPAM 2 MG PO TABS
2.0000 mg | ORAL_TABLET | Freq: Once | ORAL | Status: AC
  Administered 2017-11-22: 2 mg via ORAL
  Filled 2017-11-22: qty 1

## 2017-11-22 MED ORDER — DIAZEPAM 2 MG PO TABS
2.0000 mg | ORAL_TABLET | Freq: Two times a day (BID) | ORAL | 0 refills | Status: AC | PRN
Start: 2017-11-22 — End: 2017-11-27
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
## 2018-02-13 ENCOUNTER — Emergency Department (HOSPITAL_COMMUNITY): Payer: Medicaid Other

## 2018-02-13 ENCOUNTER — Encounter (HOSPITAL_COMMUNITY): Payer: Self-pay

## 2018-02-13 ENCOUNTER — Other Ambulatory Visit: Payer: Self-pay

## 2018-02-13 ENCOUNTER — Emergency Department (HOSPITAL_COMMUNITY)
Admission: EM | Admit: 2018-02-13 | Discharge: 2018-02-13 | Disposition: A | Discharge status: Home / Self Care | Payer: Medicaid Other | Attending: Emergency Medicine | Admitting: Emergency Medicine

## 2018-02-13 DIAGNOSIS — J45909 Unspecified asthma, uncomplicated: Secondary | ICD-10-CM | POA: Diagnosis not present

## 2018-02-13 DIAGNOSIS — R42 Dizziness and giddiness: Secondary | ICD-10-CM

## 2018-02-13 DIAGNOSIS — Z79899 Other long term (current) drug therapy: Secondary | ICD-10-CM | POA: Insufficient documentation

## 2018-02-13 DIAGNOSIS — R55 Syncope and collapse: Secondary | ICD-10-CM | POA: Diagnosis present

## 2018-02-13 DIAGNOSIS — Z87891 Personal history of nicotine dependence: Secondary | ICD-10-CM | POA: Insufficient documentation

## 2018-02-13 DIAGNOSIS — R519 Headache, unspecified: Secondary | ICD-10-CM

## 2018-02-13 DIAGNOSIS — R51 Headache: Secondary | ICD-10-CM | POA: Diagnosis not present

## 2018-02-13 LAB — CBC WITH DIFFERENTIAL/PLATELET
Abs Immature Granulocytes: 0.01 10*3/uL (ref 0.00–0.07)
Basophils Absolute: 0 10*3/uL (ref 0.0–0.1)
Basophils Relative: 1 %
EOS ABS: 0.1 10*3/uL (ref 0.0–0.5)
EOS PCT: 3 %
HEMATOCRIT: 43.5 % (ref 36.0–46.0)
Hemoglobin: 14.4 g/dL (ref 12.0–15.0)
IMMATURE GRANULOCYTES: 0 %
LYMPHS ABS: 1.5 10*3/uL (ref 0.7–4.0)
Lymphocytes Relative: 32 %
MCH: 31.6 pg (ref 26.0–34.0)
MCHC: 33.1 g/dL (ref 30.0–36.0)
MCV: 95.6 fL (ref 80.0–100.0)
MONO ABS: 0.5 10*3/uL (ref 0.1–1.0)
MONOS PCT: 11 %
Neutro Abs: 2.6 10*3/uL (ref 1.7–7.7)
Neutrophils Relative %: 53 %
PLATELETS: 234 10*3/uL (ref 150–400)
RBC: 4.55 MIL/uL (ref 3.87–5.11)
RDW: 11.9 % (ref 11.5–15.5)
WBC: 4.9 10*3/uL (ref 4.0–10.5)
nRBC: 0 % (ref 0.0–0.2)

## 2018-02-13 LAB — COMPREHENSIVE METABOLIC PANEL
ALK PHOS: 52 U/L (ref 38–126)
ALT: 13 U/L (ref 0–44)
AST: 16 U/L (ref 15–41)
Albumin: 3.8 g/dL (ref 3.5–5.0)
Anion gap: 7 (ref 5–15)
BILIRUBIN TOTAL: 0.5 mg/dL (ref 0.3–1.2)
BUN: 9 mg/dL (ref 6–20)
CHLORIDE: 110 mmol/L (ref 98–111)
CO2: 22 mmol/L (ref 22–32)
CREATININE: 0.85 mg/dL (ref 0.44–1.00)
Calcium: 9.4 mg/dL (ref 8.9–10.3)
GFR calc Af Amer: >60 mL/min (ref >60)
Glucose, Bld: 78 mg/dL (ref 70–99)
Potassium: 3.9 mmol/L (ref 3.5–5.1)
Sodium: 139 mmol/L (ref 135–145)
Total Protein: 6.4 g/dL — ABNORMAL LOW (ref 6.5–8.1)

## 2018-02-13 LAB — MAGNESIUM: MAGNESIUM: 1.9 mg/dL (ref 1.7–2.4)

## 2018-02-13 LAB — I-STAT BETA HCG BLOOD, ED (MC, WL, AP ONLY): I-stat hCG, quantitative: <5 m[IU]/mL (ref <5)

## 2018-02-13 MED ORDER — DEXAMETHASONE SODIUM PHOSPHATE 10 MG/ML IJ SOLN
10.0000 mg | Freq: Once | INTRAMUSCULAR | Status: AC
  Administered 2018-02-13: 10 mg via INTRAVENOUS
  Filled 2018-02-13: qty 1

## 2018-02-13 MED ORDER — LORAZEPAM 1 MG PO TABS: 1.0000 mg | ORAL_TABLET | Freq: Four times a day (QID) | ORAL | 0 refills | Status: DC | PRN

## 2018-02-13 MED ORDER — DIPHENHYDRAMINE HCL 50 MG/ML IJ SOLN
25.0000 mg | Freq: Once | INTRAMUSCULAR | Status: AC
  Administered 2018-02-13: 25 mg via INTRAVENOUS
  Filled 2018-02-13: qty 1

## 2018-02-13 MED ORDER — METOCLOPRAMIDE HCL 5 MG/ML IJ SOLN
10.0000 mg | Freq: Once | INTRAMUSCULAR | Status: AC
  Administered 2018-02-13: 10 mg via INTRAVENOUS
  Filled 2018-02-13: qty 2

## 2018-02-13 MED ORDER — LORAZEPAM 2 MG/ML IJ SOLN
1.0000 mg | Freq: Once | INTRAMUSCULAR | Status: AC
  Administered 2018-02-13: 1 mg via INTRAVENOUS
  Filled 2018-02-13: qty 1

## 2018-02-13 MED ORDER — SODIUM CHLORIDE 0.9 % IV BOLUS
1000.0000 mL | Freq: Once | INTRAVENOUS | Status: AC
  Administered 2018-02-13: 1000 mL via INTRAVENOUS

## 2018-02-13 MED ORDER — KETOROLAC TROMETHAMINE 15 MG/ML IJ SOLN
15.0000 mg | Freq: Once | INTRAMUSCULAR | Status: AC
  Administered 2018-02-13: 15 mg via INTRAVENOUS
  Filled 2018-02-13: qty 1

## 2018-02-13 MED ORDER — SODIUM CHLORIDE 0.9 % IV SOLN
INTRAVENOUS | Status: DC
  Administered 2018-02-13: 14:00:00 via INTRAVENOUS

## 2018-02-13 NOTE — ED Triage Notes (Signed)
Pt presents to ED with lightheadedness and migraine.  Pt reports increased frequency of migraines and this current migraine is continuous for 7 days.  Pt reports falling 2 times in the last day.  Pt reports feeling "off balance"

## 2018-02-13 NOTE — ED Notes (Signed)
Patient able to ambulate independently  

## 2018-02-13 NOTE — ED Provider Notes (Signed)
[facility] EMERGENCY DEPARTMENT Provider Note   CSN: [id number] Arrival date & time: 02/13/18  1126     History   Chief Complaint Chief Complaint  Patient presents with  . Near Syncope    HPI Angel Brock is a 32 y.o. female.  Patient is a 32 year old female with a history of bipolar disease, chronic headaches, asthma who is presenting today with 1 week of ongoing headache with complaints of dizziness, falls and trouble walking.  Patient's mother is present and states when she walks she falls to the left.  She has a sensation of feeling off balance which is worse if she moves her head or tries to walk.  She also states the headache this week has not gotten better and it feels like a pressure sitting on the top of her head.  She has some spots in her vision but denies any vision loss.  She is tried over-the-counter medications for her headache but has not taken it away which it usually does.  She has had nausea and occasional vomiting.  She denies fever or cold symptoms but went to urgent care 2 days ago and at that time was diagnosed with a sinus infection and started on amoxicillin and meclizine.  She states these medicines have not made her feel any better.  LMP was within the last month.  She takes Reglan at home but no other prescription medications.  She denies tobacco use, excessive alcohol or drug use.  No neck pain.  The history is provided by the patient.    Past Medical History:  Diagnosis Date  . Asthma   . Bilateral ovarian cysts   . Bipolar 1 disorder (HCC)   . Headache     There are no active problems to display for this patient.   Past Surgical History:  Procedure Laterality Date  . ANKLE ARTHROSCOPY Left   . APPENDECTOMY    . TONSILLECTOMY    . TUBAL LIGATION       OB History    Gravida  4   Para  4   Term  3   Preterm  1   AB      Living  4     SAB      TAB      Ectopic      Multiple      Live Births  4              Home Medications    Prior to Admission medications   Medication Sig Start Date End Date Taking? Authorizing Provider  acetaminophen (TYLENOL) 325 MG tablet Take 650 mg by mouth every 6 (six) hours as needed for mild pain, moderate pain or headache. For pain    [provider]  benzonatate (TESSALON PERLES) 100 MG capsule Take 1 capsule (100 mg total) by mouth 3 (three) times daily as needed for cough. 10/23/17   Loleta Rose, MD  HYDROcodone-homatropine [facility]) 5-1.5 MG/5ML syrup Take 5 mLs by mouth every 6 (six) hours as needed for cough. 10/23/17   Loleta Rose, MD  methocarbamol (ROBAXIN-750) 750 MG tablet Take 1 tablet (750 mg total) by mouth 4 (four) times daily. 11/18/17   Joni Reining, PA-C  oxyCODONE-acetaminophen (PERCOCET) 7.5-325 MG tablet Take 1 tablet by mouth every 6 (six) hours as needed for severe pain. 11/18/17   Joni Reining, PA-C  predniSONE (DELTASONE) 50 MG tablet Take one 50 mg tablet once daily for the next five days.  11/22/17   Orvil Feil, PA-C    Family History History reviewed. No pertinent family history.  Social History Social History   Tobacco Use  . Smoking status: Former Smoker    Packs/day: 0.50    Types: Cigarettes  . Smokeless tobacco: Never Used  Substance Use Topics  . Alcohol use: No    Comment: occasional  . Drug use: No     Allergies   2,4-d dimethylamine (amisol); Geodon [ziprasidone hcl]; Norco [hydrocodone-acetaminophen]; Tramadol; Penicillins; Zofran [ondansetron hcl]; and Rocephin [ceftriaxone]   Review of Systems Review of Systems  All other systems reviewed and are negative.    Physical Exam Updated Vital Signs BP 116/83   Pulse 70   Temp 97.9 F (36.6 C) (Oral)   Resp 17   Ht 5\' 1"  (1.549 m)   Wt 99.7 kg   LMP 01/23/2018   SpO2 99%   BMI 41.53 kg/m   Physical Exam  Constitutional: She is oriented to person, place, and time. She appears well-developed and well-nourished. No distress.   HENT:  Head: Normocephalic and atraumatic.  Mouth/Throat: Oropharynx is clear and moist.  Eyes: Pupils are equal, round, and reactive to light. Conjunctivae and EOM are normal.  No nystagmus  Neck: Normal range of motion. Neck supple. No spinous process tenderness and no muscular tenderness present. Normal range of motion present. No Brudzinski's sign and no Kernig's sign noted.  Cardiovascular: Normal rate, regular rhythm and intact distal pulses.  No murmur heard. Pulmonary/Chest: Effort normal and breath sounds normal. No respiratory distress. She has no wheezes. She has no rales.  Abdominal: Soft. She exhibits no distension. There is no tenderness. There is no rebound and no guarding.  Musculoskeletal: Normal range of motion. She exhibits no edema or tenderness.  Neurological: She is alert and oriented to person, place, and time. No cranial nerve deficit.  No pronator drift.  Slightly shaky with heel-to-shin testing bilaterally but accurate.  Finger-to-nose was normal bilaterally.  5 out of 5 strength in the upper and lower extremities bilaterally area no visual field cuts bilaterally  Skin: Skin is warm and dry. No rash noted. No erythema.  Psychiatric: She has a normal mood and affect. Her behavior is normal.  Nursing note and vitals reviewed.    ED Treatments / Results  Labs (all labs ordered are listed, but only abnormal results are displayed) Labs Reviewed  COMPREHENSIVE METABOLIC PANEL - Abnormal; Notable for the following components:      Result Value   Total Protein 6.4 (*)    All other components within normal limits  CBC WITH DIFFERENTIAL/PLATELET  MAGNESIUM  I-STAT BETA HCG BLOOD, ED ([facility], [facility], [facility] ONLY)    EKG None  Radiology Ct Head Wo Contrast  Result Date: 02/13/2018 CLINICAL DATA:  Migraine headaches for the past 7 days. Lightheadedness with 2 falls in the past 24 hours. EXAM: CT HEAD WITHOUT CONTRAST TECHNIQUE: Contiguous axial images were obtained from the  base of the skull through the vertex without intravenous contrast. COMPARISON:  09/19/2017. 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 Otherwise, normal appearing cerebral hemispheres and posterior fossa structures. Normal size and position of the ventricles. Vascular: No hyperdense vessel or unexpected calcification. Skull: Normal. Negative for fracture or focal lesion. Sinuses/Orbits: Unremarkable. Other: None. IMPRESSION: No acute abnormality. Stable pineal cyst. Electronically Signed   By: Beckie Salts M.D.   On: 02/13/2018 12:40   Mr Brain Wo Contrast  Result Date: 02/13/2018 CLINICAL DATA:  Initial evaluation for acute lightheadedness, migraine. EXAM: MRI HEAD WITHOUT CONTRAST MRV HEAD WITHOUT CONTRAST TECHNIQUE: Multiplanar, multiecho pulse sequences of the brain and surrounding structures were obtained without intravenous contrast. Angiographic images of the intracranial venous structures were obtained using MRV technique without intravenous contrast. COMPARISON:  Prior CT from earlier same day. FINDINGS: MRI HEAD FINDINGS Brain: Cerebral volume within normal limits for patient age. No focal parenchymal signal abnormality identified. No abnormal foci of restricted diffusion to suggest acute or subacute ischemia. Gray-white matter differentiation well maintained. No encephalomalacia to suggest chronic infarction. No foci of susceptibility artifact to suggest acute or chronic intracranial hemorrhage. Mass lesion, midline shift or mass effect. No hydrocephalus. No extra-axial fluid collection. Major dural sinuses are grossly patent. Pituitary gland and suprasellar region are normal. 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 Vascular: Major intracranial vascular flow voids well maintained and normal in appearance. Skull and upper cervical spine: Craniocervical junction normal. Visualized upper cervical spine within normal limits.  Bone marrow signal intensity normal. No scalp soft tissue abnormality. Sinuses/Orbits: Globes and orbital soft tissues within normal limits. Paranasal sinuses are clear. No mastoid effusion. Inner ear structures normal. Other: None. MRV HEAD FINDINGS Normal flow related signal seen throughout the superior sagittal sinus to the level of the torcula. Transverse and sigmoid sinuses are widely patent bilaterally as are the visualized proximal internal jugular veins. Straight sinus, vein of Galen, and internal cerebral veins patent bilaterally. Visualized basal veins of Rosenthal appear patent. No findings to suggest dural sinus thrombosis. IMPRESSION: MRI HEAD IMPRESSION: 1. No acute intracranial abnormality. 2. 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 3. Otherwise normal brain MRI. MRV HEAD IMPRESSION: Normal MRV of the head.  No evidence for dural sinus thrombosis. Electronically Signed   By: Rise Mu M.D.   On: 02/13/2018 15:46   Mr Mrv Head Wo Cm  Result Date: 02/13/2018 CLINICAL DATA:  Initial evaluation for acute lightheadedness, migraine. EXAM: MRI HEAD WITHOUT CONTRAST MRV HEAD WITHOUT CONTRAST TECHNIQUE: Multiplanar, multiecho pulse sequences of the brain and surrounding structures were obtained without intravenous contrast. Angiographic images of the intracranial venous structures were obtained using MRV technique without intravenous contrast. COMPARISON:  Prior CT from earlier same day. FINDINGS: MRI HEAD FINDINGS Brain: Cerebral volume within normal limits for patient age. No focal parenchymal signal abnormality identified. No abnormal foci of restricted diffusion to suggest acute or subacute ischemia. Gray-white matter differentiation well maintained. No encephalomalacia to suggest chronic infarction. No foci of susceptibility artifact to suggest acute or chronic intracranial hemorrhage. Mass lesion, midline shift or mass effect. No hydrocephalus. No extra-axial fluid collection. Major dural  sinuses are grossly patent. Pituitary gland and suprasellar region are normal. 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 Vascular: Major intracranial vascular flow voids well maintained and normal in appearance. Skull and upper cervical spine: Craniocervical junction normal. Visualized upper cervical spine within normal limits. Bone marrow signal intensity normal. No scalp soft tissue abnormality. Sinuses/Orbits: Globes and orbital soft tissues within normal limits. Paranasal sinuses are clear. No mastoid effusion. Inner ear structures normal. Other: None. MRV HEAD FINDINGS Normal flow related signal seen throughout the superior sagittal sinus to the level of the torcula. Transverse and sigmoid sinuses are widely patent bilaterally as are the visualized proximal internal jugular veins. Straight sinus, vein of Galen, and internal cerebral veins patent bilaterally. Visualized basal veins of Rosenthal appear patent. No findings to suggest dural  sinus thrombosis. IMPRESSION: MRI HEAD IMPRESSION: 1. No acute intracranial abnormality. 2. 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 3. Otherwise normal brain MRI. MRV HEAD IMPRESSION: Normal MRV of the head.  No evidence for dural sinus thrombosis. Electronically Signed   By: Rise MuBenjamin  McClintock M.D.   On: 02/13/2018 15:46    Procedures Procedures (including critical care time)  Medications Ordered in ED Medications  sodium chloride 0.9 % bolus 1,000 mL (1,000 mLs Intravenous New Bag/Given 02/13/18 1201)  0.9 %  sodium chloride infusion (has no administration in time range)  diphenhydrAMINE (BENADRYL) injection 25 mg (25 mg Intravenous Given 02/13/18 1159)  dexamethasone (DECADRON) injection 10 mg (10 mg Intravenous Given 02/13/18 1200)  metoCLOPramide (REGLAN) injection 10 mg (10 mg Intravenous Given 02/13/18 1159)     Initial Impression / Assessment and Plan / ED Course  I have reviewed the triage vital signs and the  nursing notes.  Pertinent labs & imaging results that were available during my care of the patient were reviewed by me and considered in my medical decision making (see chart for details).     Patient presenting with 1 week of headache that is not resolved with over-the-counter medications, feelings of dizziness and falls.  She states she feels unsteady when she gets up to walk and mom states she falls to her left.  She is slightly shaky on exam especially with heel-to-shin testing but has no other acute findings.  She denies alcohol use and low suspicion for withdrawal.  She was given amoxicillin and meclizine 3 days ago at urgent care when she was diagnosed with a sinus infection.  However patient has not had significant congestion, cough or runny nose.  She denies any ear pain.  No prior history of vertigo in the past. Possible intracranial hypertension (pseudotumor cerebri), complex migraine headache versus other intracranial pathology. Low suspicion for vertebral dissection as she has no neck pain.  Low suspicion for stroke. Will start with a CT of the head.  Patient given a headache cocktail.  Labs are pending.  Vital signs are reassuring.  Low suspicion for partially treated meningitis or [facility].  1:53 PM Patient's labs are reassuring.  CT shows a stable pineal cyst.  On repeat evaluation patient is still symptomatic despite a headache cocktail she still having ongoing headache and pressure.  We will do an MRI/MRV to rule out other acute processes.  4:03 PM Pt's MR/MRV is wnl.  Will d/c home for pt to f/u with neurology.  Final Clinical Impressions(s) / ED Diagnoses   Final diagnoses:  Acute nonintractable headache, unspecified headache type  Vertigo    ED Discharge Orders    None       Gwyneth SproutPlunkett, Seichi Kaufhold, MD 02/13/18 1604

## 2018-02-13 NOTE — ED Notes (Signed)
To MRI

## 2018-02-16 ENCOUNTER — Encounter: Payer: Self-pay | Admitting: Neurology

## 2018-04-22 ENCOUNTER — Emergency Department
Admission: EM | Admit: 2018-04-22 | Discharge: 2018-04-22 | Disposition: A | Discharge status: Home / Self Care | Payer: Worker's Compensation | Attending: Emergency Medicine | Admitting: Emergency Medicine

## 2018-04-22 ENCOUNTER — Encounter: Payer: Self-pay | Admitting: Emergency Medicine

## 2018-04-22 ENCOUNTER — Other Ambulatory Visit: Payer: Self-pay

## 2018-04-22 DIAGNOSIS — Y9389 Activity, other specified: Secondary | ICD-10-CM | POA: Insufficient documentation

## 2018-04-22 DIAGNOSIS — Y990 Civilian activity done for income or pay: Secondary | ICD-10-CM | POA: Diagnosis not present

## 2018-04-22 DIAGNOSIS — Z79899 Other long term (current) drug therapy: Secondary | ICD-10-CM | POA: Diagnosis not present

## 2018-04-22 DIAGNOSIS — Y929 Unspecified place or not applicable: Secondary | ICD-10-CM | POA: Insufficient documentation

## 2018-04-22 DIAGNOSIS — S41112A Laceration without foreign body of left upper arm, initial encounter: Secondary | ICD-10-CM | POA: Diagnosis present

## 2018-04-22 DIAGNOSIS — Z87891 Personal history of nicotine dependence: Secondary | ICD-10-CM | POA: Diagnosis not present

## 2018-04-22 DIAGNOSIS — W260XXA Contact with knife, initial encounter: Secondary | ICD-10-CM | POA: Diagnosis not present

## 2018-04-22 MED ORDER — IBUPROFEN 800 MG PO TABS
800.0000 mg | ORAL_TABLET | Freq: Once | ORAL | Status: AC
Start: 2018-04-22 — End: 2018-04-22
  Administered 2018-04-22: 800 mg via ORAL
  Filled 2018-04-22: qty 1

## 2018-04-22 MED ORDER — LIDOCAINE HCL (PF) 1 % IJ SOLN
10.0000 mL | Freq: Once | INTRAMUSCULAR | Status: AC
  Administered 2018-04-22: 5 mL
  Filled 2018-04-22: qty 10

## 2018-04-22 MED ORDER — CYCLOBENZAPRINE HCL 5 MG PO TABS: 5.0000 mg | ORAL_TABLET | Freq: Three times a day (TID) | ORAL | 0 refills | Status: AC | PRN

## 2018-04-22 NOTE — ED Notes (Signed)
Patient changed her mind and consented to urine drug screen.
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
## 2018-04-22 NOTE — Discharge Instructions (Addendum)
Keep the wound clean, dry, and covered. Follow-up with your company provider for suture removal in 10 days as discussed. Take OTC Tylenol and Motrin in addition to the muscle relaxant, for pain and discomfort.
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
## 2018-04-22 NOTE — ED Notes (Signed)
Patient refusing to give urine sample for urine drug screen test for worker's compensation.

## 2018-04-27 ENCOUNTER — Ambulatory Visit: Payer: Medicaid Other | Admitting: Neurology

## 2018-04-27 ENCOUNTER — Encounter: Payer: Self-pay | Admitting: Neurology

## 2018-04-27 VITALS — BP 100/68 | HR 81 | Ht 61.0 in | Wt 222.0 lb

## 2018-04-27 DIAGNOSIS — G43719 Chronic migraine without aura, intractable, without status migrainosus: Secondary | ICD-10-CM

## 2018-04-27 DIAGNOSIS — E348 Other specified endocrine disorders: Secondary | ICD-10-CM | POA: Diagnosis not present

## 2018-04-27 MED ORDER — TOPIRAMATE 50 MG PO TABS: 50.0000 mg | ORAL_TABLET | Freq: Every day | ORAL | 5 refills | Status: DC

## 2018-04-27 MED ORDER — SUMATRIPTAN SUCCINATE 100 MG PO TABS: ORAL_TABLET | ORAL | 5 refills | Status: DC

## 2018-04-27 NOTE — Patient Instructions (Signed)
Migraine Recommendations: 1.  Start topiramate 50mg  at bedtime.  If headaches not improved in 4 weeks, contact me with update and we can adjust dose if needed. 2.  Take sumatriptan 100mg  at earliest onset of headache.  May repeat dose once in 2 hours if needed.  Do not exceed two tablets in 24 hours. 3.  STOP OVER THE COUNTER PAIN RELIEVERS.  DO NOT USE FIORICET.  Limit use of pain relievers to no more than 2 days out of the week.  These medications include acetaminophen, ibuprofen, triptans and narcotics.  This will help reduce risk of rebound headaches. 4.  Be aware of common food triggers such as processed sweets, processed foods with nitrites (such as deli meat, hot dogs, sausages), foods with MSG, alcohol (such as wine), chocolate, certain cheeses, certain fruits (dried fruits, bananas, pineapple), vinegar, diet soda. 4.  Avoid caffeine 5.  Routine exercise 6.  Proper sleep hygiene 7.  Stay adequately hydrated with water 8.  Keep a headache diary. 9.  Maintain proper stress management. 10.  Do not skip meals. 11.  Consider supplements:  Magnesium citrate 400mg  to 600mg  daily, riboflavin 400mg , Coenzyme Q 10 100mg  three times daily   Migraine Headache  A migraine headache is a very strong throbbing pain on one side or both sides of your head. Migraines can also cause other symptoms. Talk with your doctor about what things may bring on (trigger) your migraine headaches. Follow these instructions at home: Medicines  Take over-the-counter and prescription medicines only as told by your doctor.  Do not drive or use heavy machinery while taking prescription pain medicine.  To prevent or treat constipation while you are taking prescription pain medicine, your doctor may recommend that you: ? Drink enough fluid to keep your pee (urine) clear or pale yellow. ? Take over-the-counter or prescription medicines. ? Eat foods that are high in fiber. These include fresh fruits and vegetables, whole  grains, and beans. ? Limit foods that are high in fat and processed sugars. These include fried and sweet foods. Lifestyle  Avoid alcohol.  Do not use any products that contain nicotine or tobacco, such as cigarettes and e-cigarettes. If you need help quitting, ask your doctor.  Get at least 8 hours of sleep every night.  Limit your stress. General instructions   Keep a journal to find out what may bring on your migraines. For example, write down: ? What you eat and drink. ? How much sleep you get. ? Any change in what you eat or drink. ? Any change in your medicines.  If you have a migraine: ? Avoid things that make your symptoms worse, such as bright lights. ? It may help to lie down in a dark, quiet room. ? Do not drive or use heavy machinery. ? Ask your doctor what activities are safe for you.  Keep all follow-up visits as told by your doctor. This is important. Contact a doctor if:  You get a migraine that is different or worse than your usual migraines. Get help right away if:  Your migraine gets very bad.  You have a fever.  You have a stiff neck.  You have trouble seeing.  Your muscles feel weak or like you cannot control them.  You start to lose your balance a lot.  You start to have trouble walking.  You pass out (faint). This information is not intended to replace advice given to you by your health care provider. Make sure you discuss any  questions you have with your health care provider. Document Released: 12/19/2007 Document Revised: 12/03/2017 Document Reviewed: 08/28/2015 Elsevier Interactive Patient Education  2019 [employer].
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
## 2018-06-15 IMAGING — CR DG CHEST 2V
1 series · 2 of 2 positions shown · non-contrast
Comparison: Chest x-ray of May 01, 2016

CLINICAL DATA: Cough, chest congestion, diarrhea, chills, and
fever. History of asthma, current smoker.

EXAM:
CHEST  2 VIEW

[Series 1: w chest pa · 0.14mm/px · 2 of 2 slices shown]
[im 1/2]
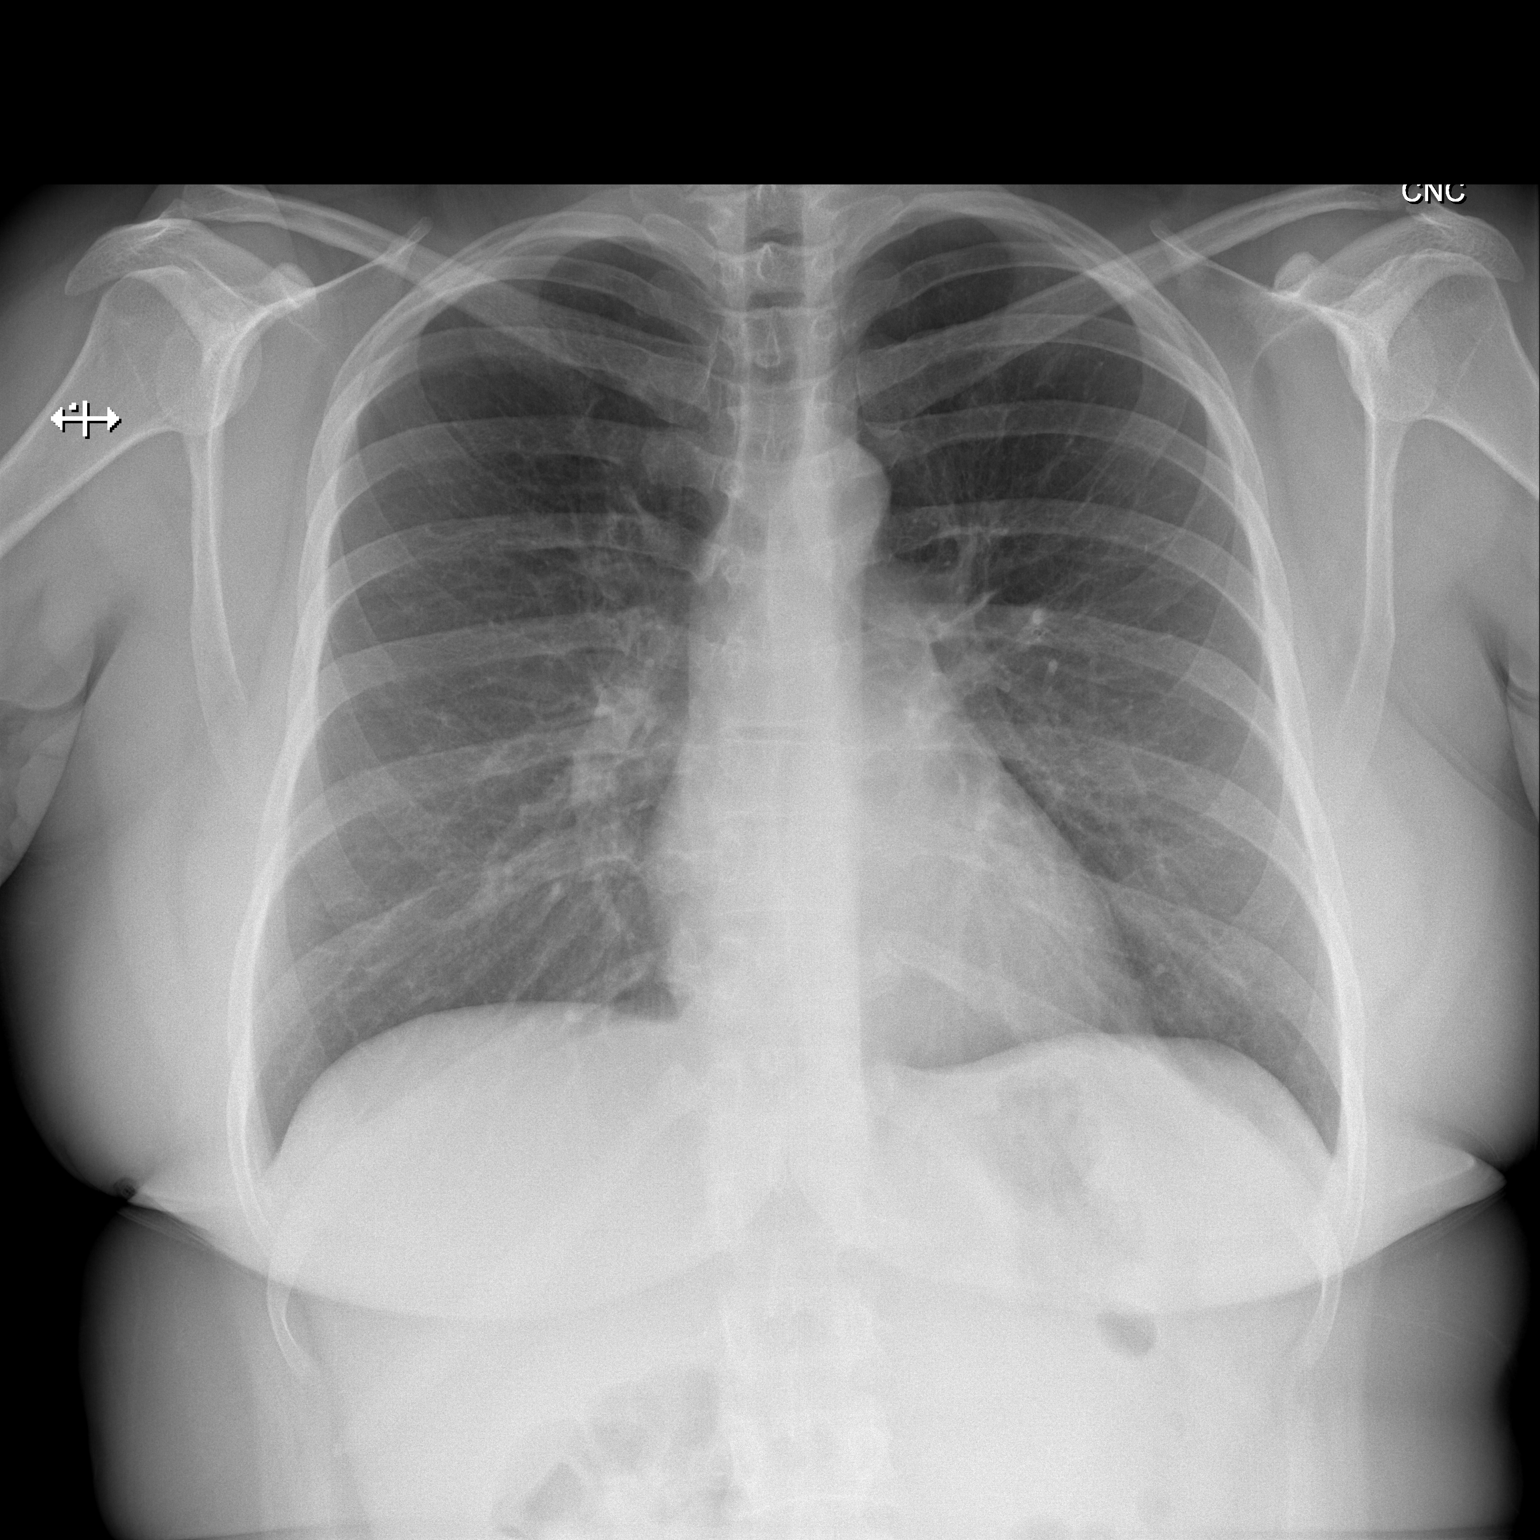
[im 2/2]
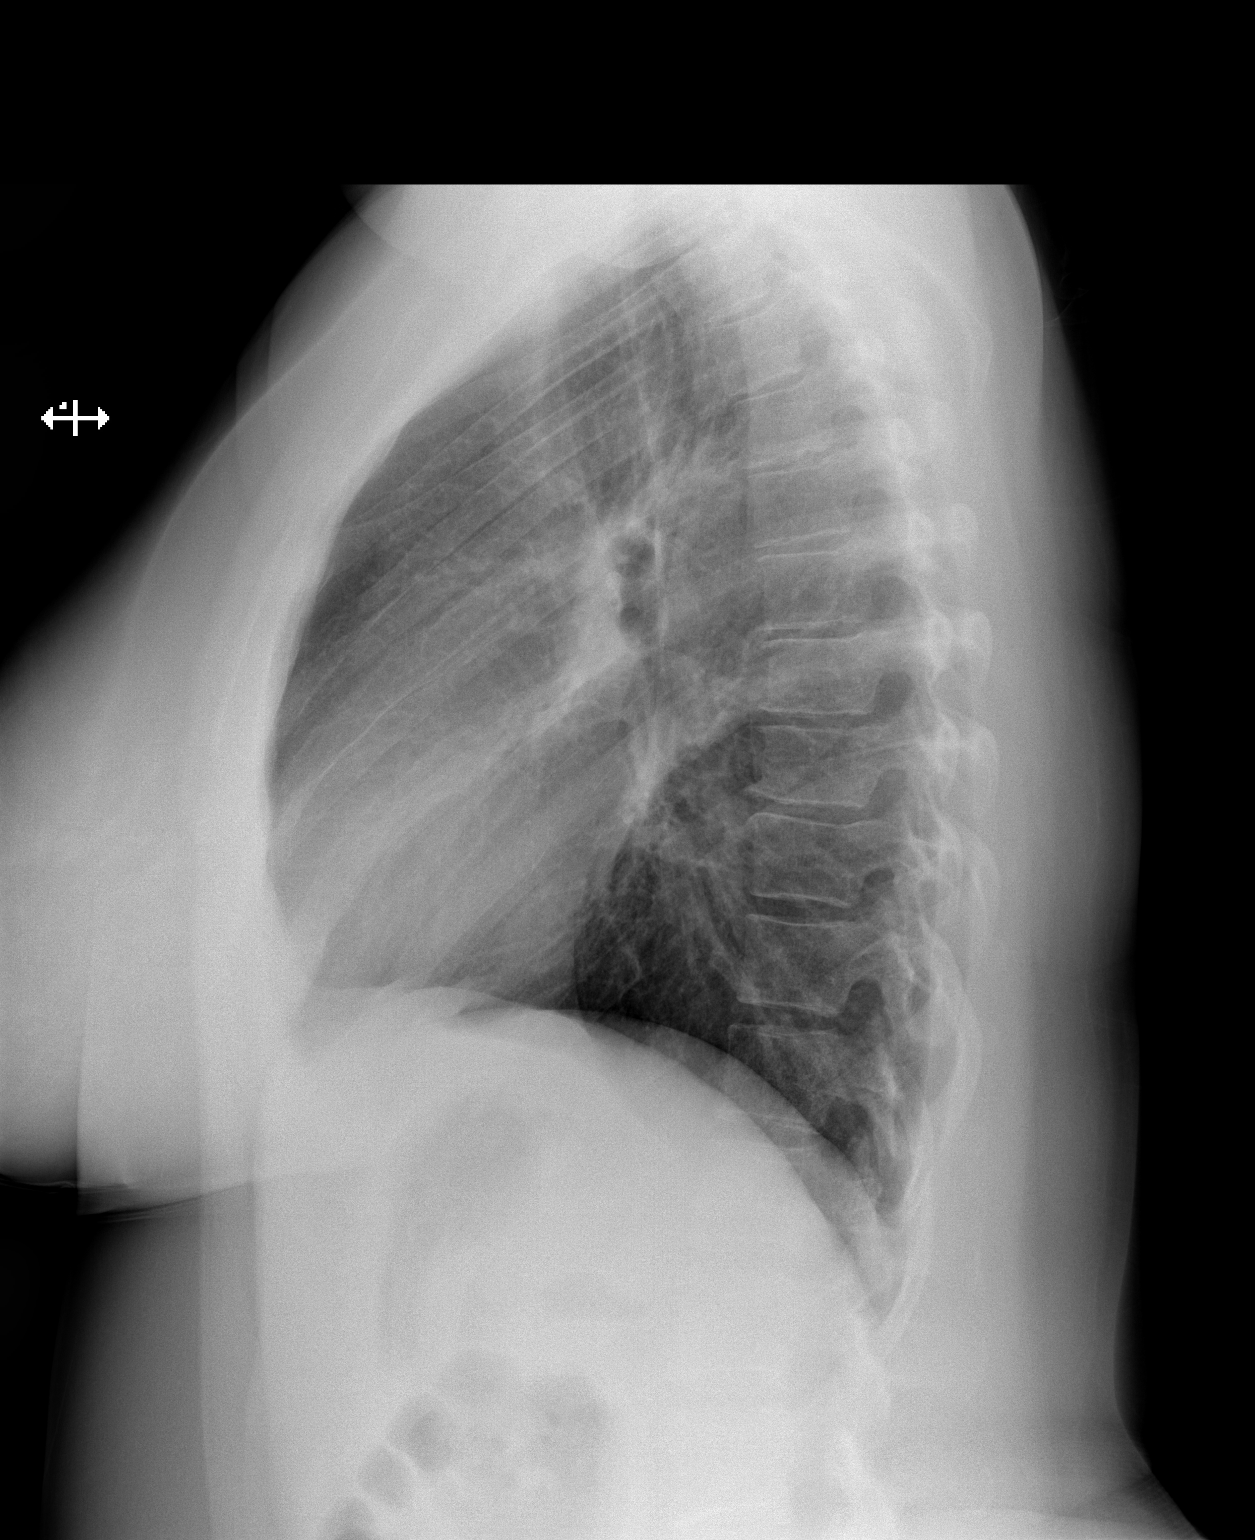

[2 of 2 positions shown; findings below may reference images not displayed]

FINDINGS: The lungs are adequately inflated. There is no focal infiltrate.
There is no pleural effusion. The heart and pulmonary vascularity
are normal. The mediastinum is normal in width. The bony thorax
exhibits no acute abnormality.
IMPRESSION: There is no acute cardiopulmonary abnormality.

## 2018-08-27 ENCOUNTER — Other Ambulatory Visit: Payer: Self-pay

## 2018-08-27 ENCOUNTER — Telehealth: Payer: Medicaid Other | Admitting: Neurology

## 2018-08-27 NOTE — Progress Notes (Signed)
No-show virtual visit. Did not respond to text/phone

## 2018-09-01 ENCOUNTER — Ambulatory Visit: Payer: Self-pay | Admitting: Neurology

## 2019-02-11 ENCOUNTER — Other Ambulatory Visit: Payer: Self-pay

## 2019-02-11 ENCOUNTER — Emergency Department (HOSPITAL_COMMUNITY)
Admission: EM | Admit: 2019-02-11 | Discharge: 2019-02-11 | Disposition: A | Discharge status: Home / Self Care | Payer: Medicaid Other | Attending: Emergency Medicine | Admitting: Emergency Medicine

## 2019-02-11 DIAGNOSIS — Z20828 Contact with and (suspected) exposure to other viral communicable diseases: Secondary | ICD-10-CM | POA: Insufficient documentation

## 2019-02-11 DIAGNOSIS — R519 Headache, unspecified: Secondary | ICD-10-CM | POA: Diagnosis not present

## 2019-02-11 DIAGNOSIS — Z79899 Other long term (current) drug therapy: Secondary | ICD-10-CM | POA: Diagnosis not present

## 2019-02-11 DIAGNOSIS — J45909 Unspecified asthma, uncomplicated: Secondary | ICD-10-CM | POA: Diagnosis not present

## 2019-02-11 DIAGNOSIS — Z87891 Personal history of nicotine dependence: Secondary | ICD-10-CM | POA: Diagnosis not present

## 2019-02-11 DIAGNOSIS — R05 Cough: Secondary | ICD-10-CM | POA: Diagnosis not present

## 2019-02-11 DIAGNOSIS — Z20822 Contact with and (suspected) exposure to covid-19: Secondary | ICD-10-CM

## 2019-02-11 LAB — SARS CORONAVIRUS 2 (TAT 6-24 HRS): SARS Coronavirus 2: NEGATIVE

## 2019-02-11 MED ORDER — BENZONATATE 100 MG PO CAPS: 100.0000 mg | ORAL_CAPSULE | Freq: Three times a day (TID) | ORAL | 0 refills | Status: AC

## 2019-02-11 NOTE — ED Notes (Signed)
Patient verbalizes understanding of discharge instructions. Opportunity for questioning and answers were provided. Armband removed by staff, pt discharged from ED ambulatory to home.  

## 2019-02-11 NOTE — ED Triage Notes (Signed)
Pt here for evaluation of cough, headache, nasal congestion, and fatigue since yesterday. Pt's husband has been sick for one week, not yet tested for covid.

## 2019-02-11 NOTE — ED Provider Notes (Signed)
[facility]ENC[facility] DEPART[facility] Provider Note   CSN: [id number] Arrival date & time: 02/11/19  1225     History   Chief Complaint No chief complaint on file.   HPI Angel Brock is a 33 y.o. female.     The history is provided by the patient. No language interpreter was used.     33 year old female with history of bipolar, asthma, presenting with cold symptoms.  Patient report for the past 2 days she has gradually had onset of headache, nonproductive cough, nasal congestion, generalized fatigue.  Symptom which is similar to her husband who has the same symptom that she has except more severe.  She denies having increase shortness of breath, loss of taste or smell or body aches.  She rates her pain as 5 out of 10.  She denies increasing use of her inhaler.  She denies any specific recent sick contacts at home her husband.  She is a former smoker.  No complaints of nausea vomiting or diarrhea except one episode of nonbloody nonvomiting yesterday.  Past Medical History:  Diagnosis Date  . Asthma   . Bilateral ovarian cysts   . Bipolar 1 disorder ([place])   . Headache     There are no active problems to display for this patient.   Past Surgical History:  Procedure Laterality Date  . ANKLE ARTHROSCOPY Left   . APPENDECTOMY    . TONSILLECTOMY    . TUBAL LIGATION       OB History    Gravida  4   Para  4   Term  3   Preterm  1   AB      Living  4     SAB      TAB      Ectopic      Multiple      Live Births  4            Home Medications    Prior to Admission medications   Medication Sig Start Date End Date Taking? Authorizing Provider  acetaminophen (TYLENOL) 325 MG tablet Take 650 mg by mouth every 6 (six) hours as needed for mild pain, moderate pain or headache. For pain    [provider]  cyclobenzaprine (FLEXERIL) 5 MG tablet Take 1 tablet (5 mg total) by mouth 3 (three) times daily as needed for muscle spasms.  04/22/18   Menshew, Dannielle Karvonen, PA-C  Multiple Vitamin (MULTIVITAMIN) capsule Take 1 capsule by mouth daily.    [provider]  SUMAtriptan (IMITREX) 100 MG tablet Take 1 tablet at earliest onset of migraine.  May repeat x1 in 2 hours if headache persists or recurs.  Max 2 tablets/24 h 04/27/18   Tomi Likens, Adam R, DO  topiramate (TOPAMAX) 50 MG tablet Take 1 tablet (50 mg total) by mouth at bedtime. 04/27/18   Pieter Partridge, DO    Family History Family History  Problem Relation Age of Onset  . CVA Mother   . COPD Mother   . Emphysema Mother   . Kidney disease Mother   . Migraines Mother   . CVA Father   . Hypertension Father   . Arthritis Father   . Other Brother 29       yellow jacket   . Heart disease Brother        RBBB    Social History Social History   Tobacco Use  . Smoking status: Former Smoker    Packs/day: 0.50  Types: Cigarettes  . Smokeless tobacco: Never Used  Substance Use Topics  . Alcohol use: No    Comment: occasional  . Drug use: No     Allergies   2,4-d dimethylamine (amisol); Geodon [ziprasidone hcl]; Norco [hydrocodone-acetaminophen]; Tramadol; Penicillins; Zofran [ondansetron hcl]; and Rocephin [ceftriaxone]   Review of Systems Review of Systems  All other systems reviewed and are negative.    Physical Exam Updated Vital Signs BP 108/63 (BP Location: Right Arm)   Temp 98.8 F (37.1 C) (Oral)   Resp 15   LMP 01/24/2019 (Exact Date)   SpO2 96%   Physical Exam Vitals signs and nursing note reviewed.  Constitutional:      General: She is not in acute distress.    Appearance: She is well-developed. She is obese.  HENT:     Head: Atraumatic.     Mouth/Throat:     Mouth: Mucous membranes are moist.  Eyes:     Conjunctiva/sclera: Conjunctivae normal.  Neck:     Musculoskeletal: Neck supple.  Cardiovascular:     Rate and Rhythm: Normal rate and regular rhythm.     Pulses: Normal pulses.     Heart sounds: Normal heart sounds.   Pulmonary:     Effort: Pulmonary effort is normal.     Breath sounds: Normal breath sounds. No wheezing, rhonchi or rales.  Abdominal:     Palpations: Abdomen is soft.     Tenderness: There is no abdominal tenderness.  Skin:    Findings: No rash.  Neurological:     Mental Status: She is alert and oriented to person, place, and time.  Psychiatric:        Mood and Affect: Mood normal.      ED Treatments / Results  Labs (all labs ordered are listed, but only abnormal results are displayed) Labs Reviewed  SARS CORONAVIRUS 2 (TAT 6-24 HRS)    EKG None  Radiology No results found.  Procedures Procedures (including critical care time)  Medications Ordered in ED Medications - No data to display   Initial Impression / Assessment and Plan / ED Course  I have reviewed the triage vital signs and the nursing notes.  Pertinent labs & imaging results that were available during my care of the patient were reviewed by me and considered in my medical decision making (see chart for details).        BP 108/63 (BP Location: Right Arm)   Temp 98.8 F (37.1 C) (Oral)   Resp 15   LMP 01/24/2019 (Exact Date)   SpO2 96%    Final Clinical Impressions(s) / ED Diagnoses   Final diagnoses:  Suspected COVID-19 virus infection    ED Discharge Orders    None     Angel Brock was evaluated in Emergency Department on 02/11/2019 for the symptoms described in the history of present illness. She was evaluated in the context of the global COVID-19 pandemic, which necessitated consideration that the patient might be at risk for infection with the SARS-CoV-2 virus that causes COVID-19. Institutional protocols and algorithms that pertain to the evaluation of patients at risk for COVID-19 are in a state of rapid change based on information released by regulatory bodies including the CDC and federal and state organizations. These policies and algorithms were followed during the patient's care  in the ED.    Fayrene Helper, PA-C 02/11/19 1510    Little, Ambrose [place], MD 02/11/19 [phone]

## 2019-03-22 IMAGING — CR DG LUMBAR SPINE 2-3V
3 series · 3 of 3 positions shown · non-contrast
Comparison: CT 12/18/2016 reformats

CLINICAL DATA: Patient fell last evening landing on right hip.
Right lower back pain radiating to the right hip and thigh.

EXAM:
LUMBAR SPINE - 2-3 VIEW

[l-spine ap]
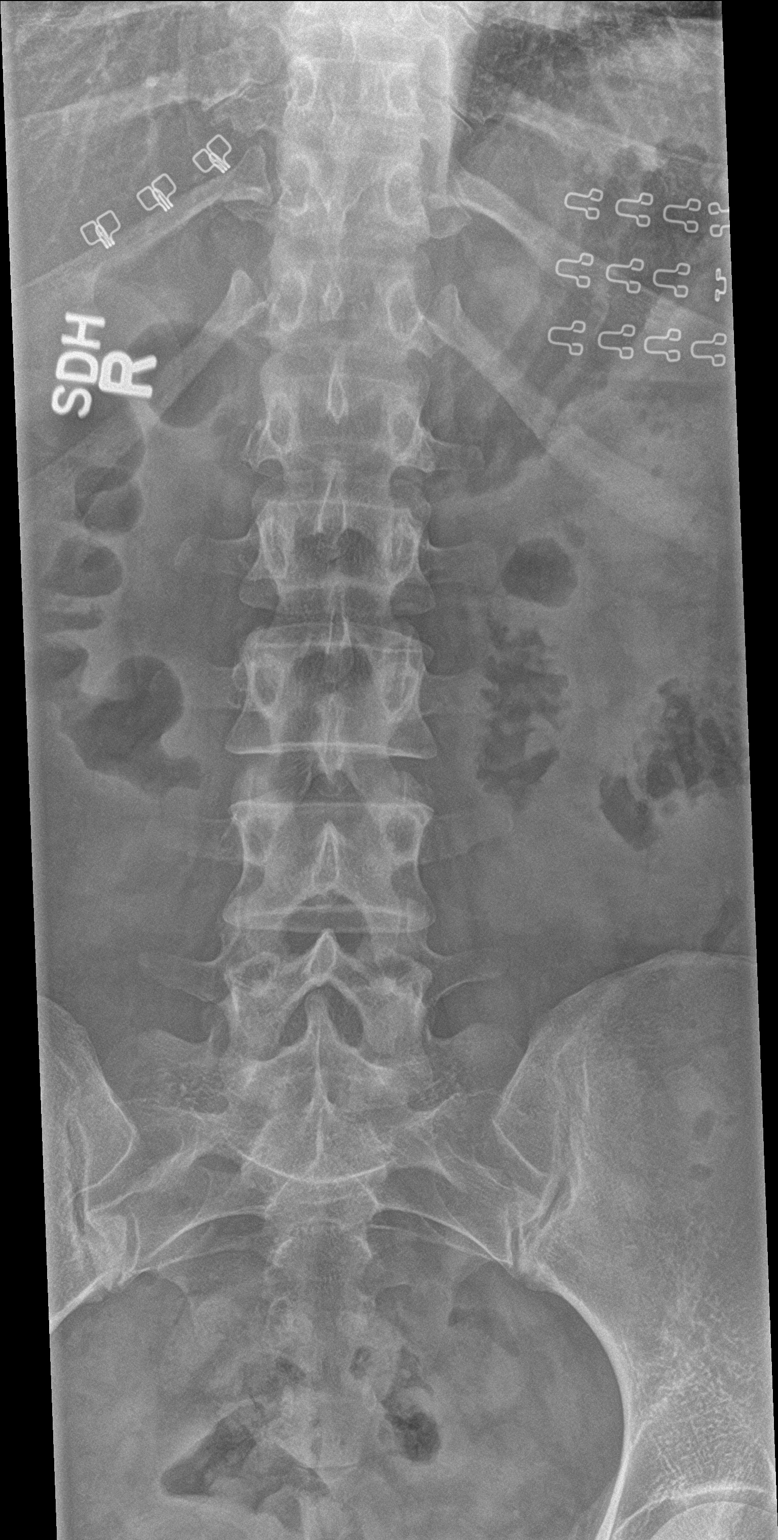

[l-spine lat]
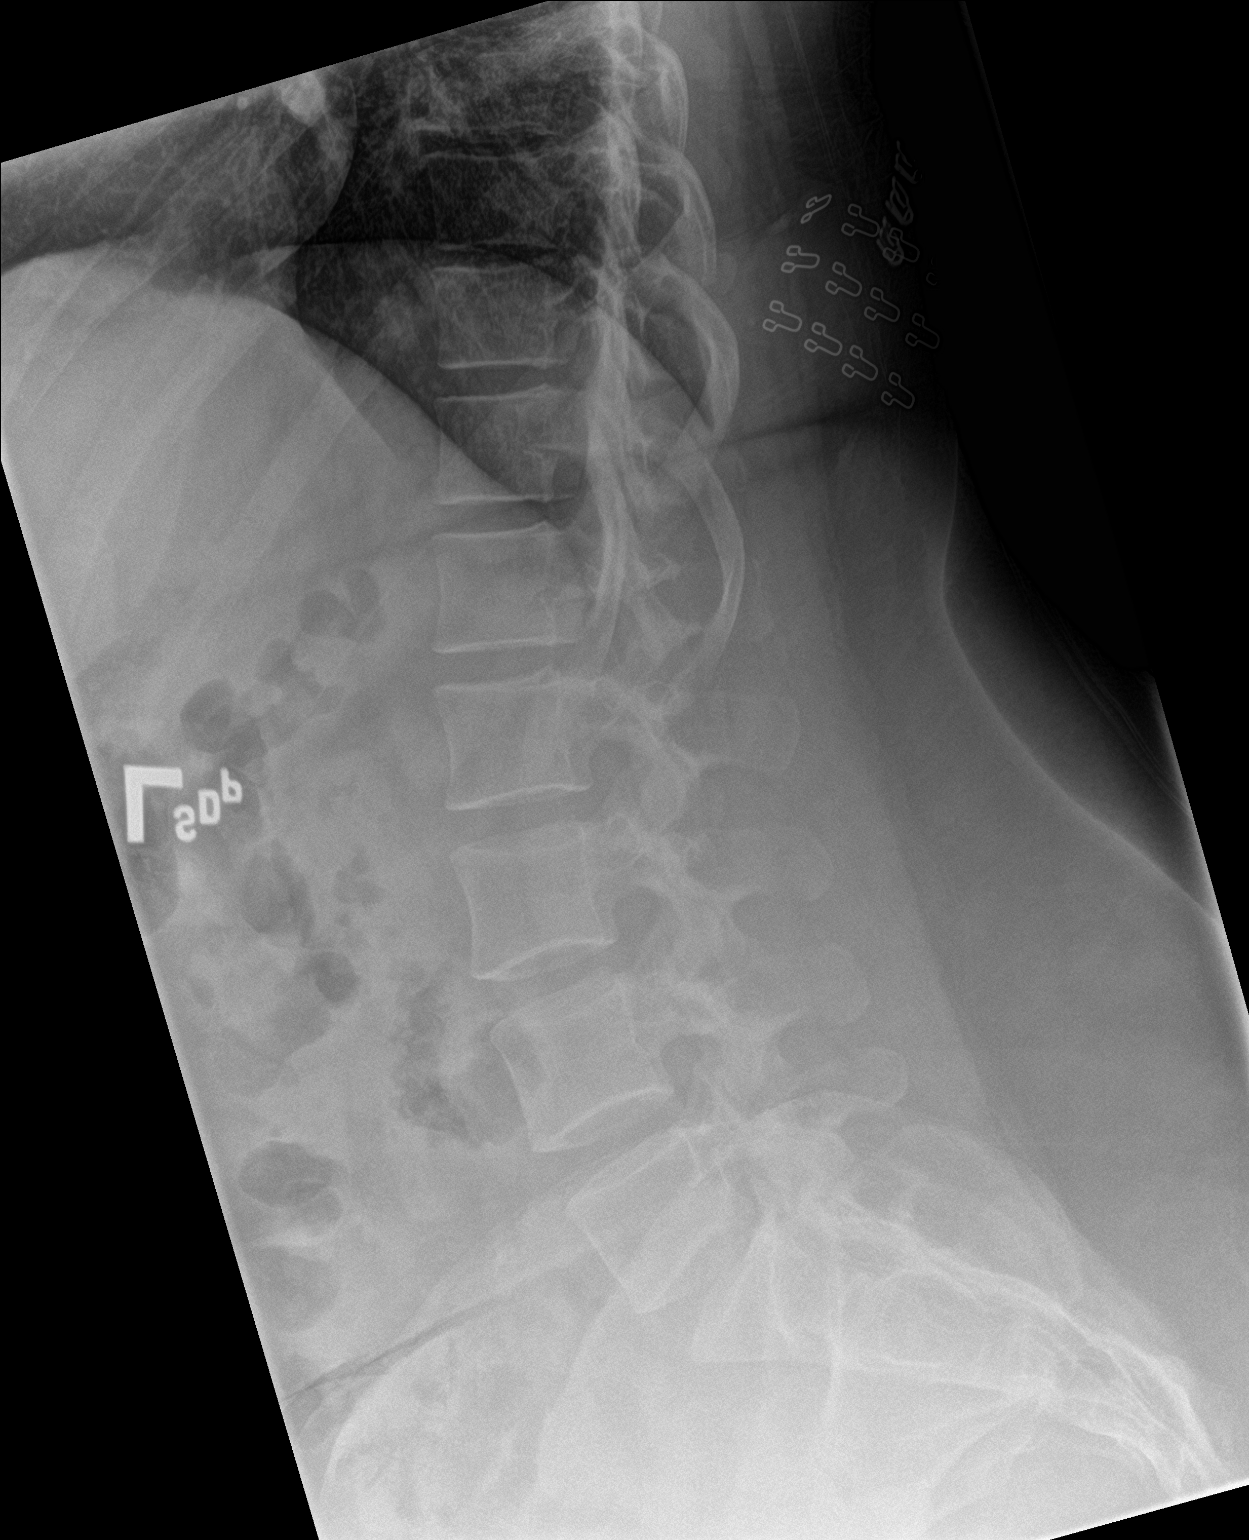

[l-spine spot]
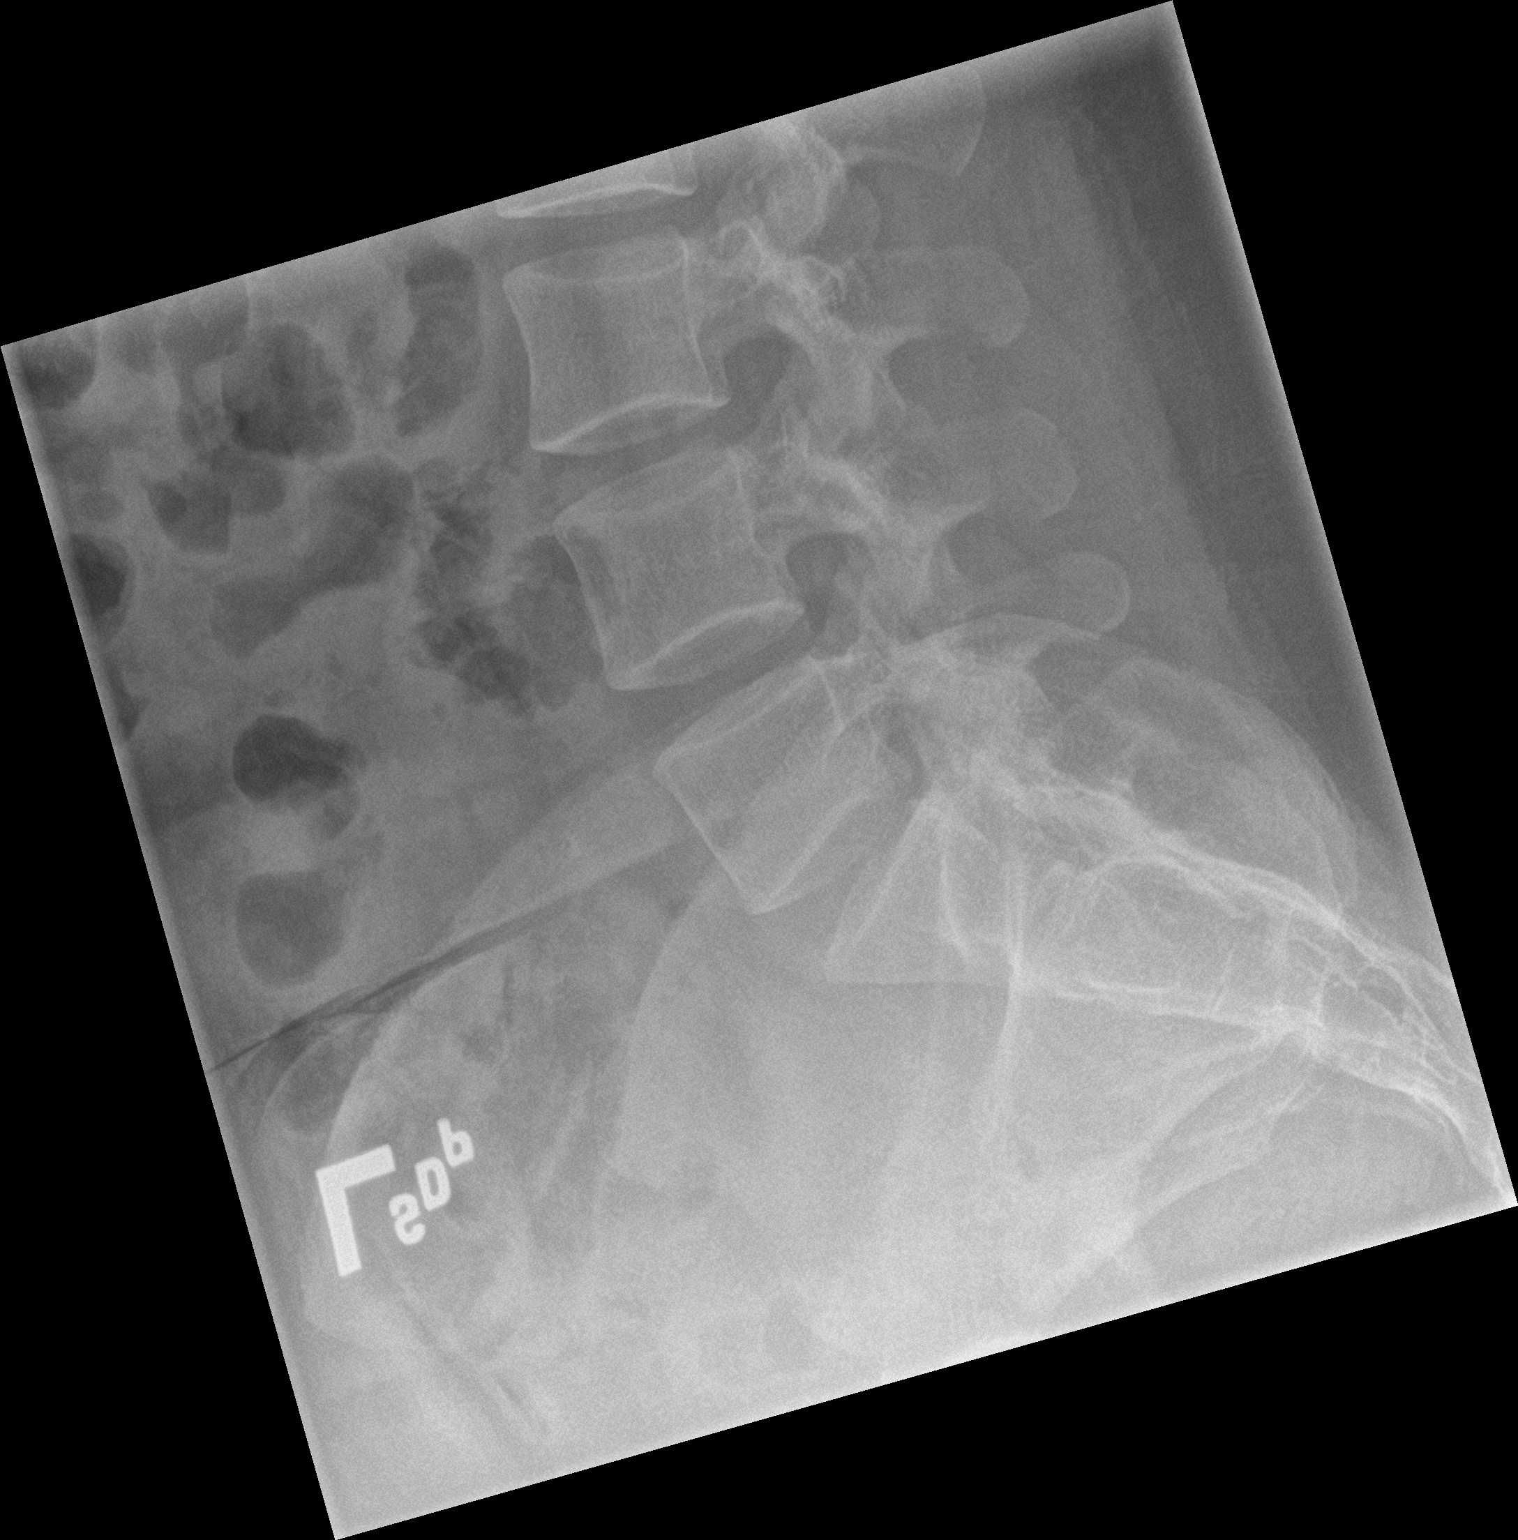

[3 of 3 positions shown; findings below may reference images not displayed]

FINDINGS: There is no evidence of lumbar spine fracture. Alignment is normal.
Intervertebral disc spaces are maintained.
IMPRESSION: Negative.

## 2019-08-03 IMAGING — CT CT HEAD W/O CM
4 series · 16 of 47 positions shown, 18 images · non-contrast
Comparison: 09/19/2017.

CLINICAL DATA: Migraine headaches for the past 7 days.
Lightheadedness with 2 falls in the past 24 hours.

EXAM:
CT HEAD WITHOUT CONTRAST
TECHNIQUE: Contiguous axial images were obtained from the base of the skull
through the vertex without intravenous contrast.

[Series 3: head without · axial · non-contrast · 0.39mm/px · z∈[-89,+31]mm · 7 of 32 slices shown, 9 images]
[im 4/32  brain]
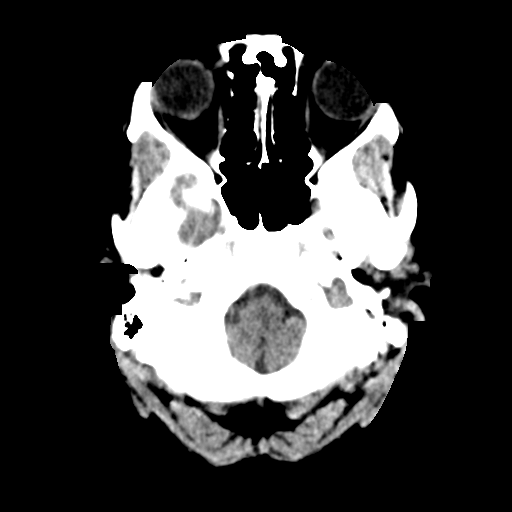
[im 4/32  bone]
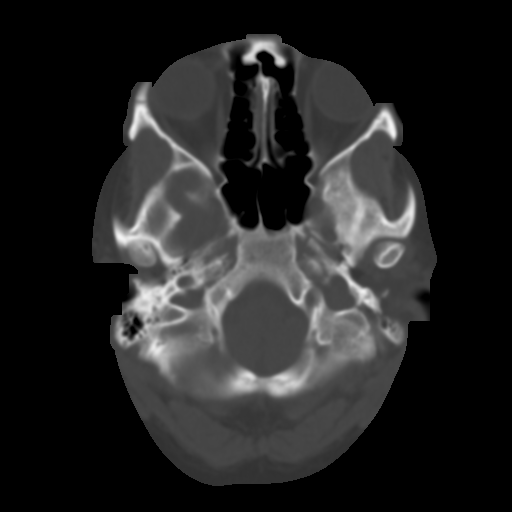
[im 8/32  brain]
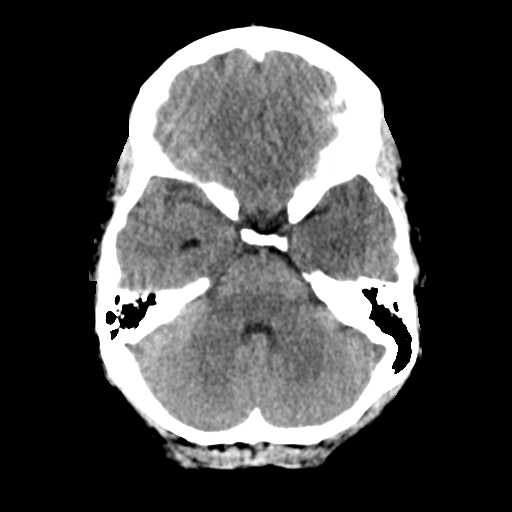
[im 12/32  brain]
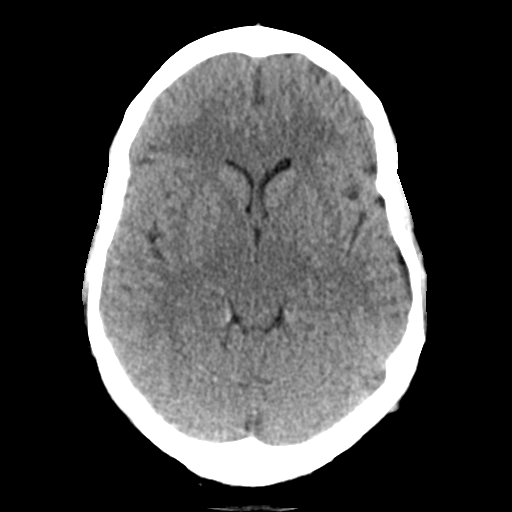
[im 16/32  brain]
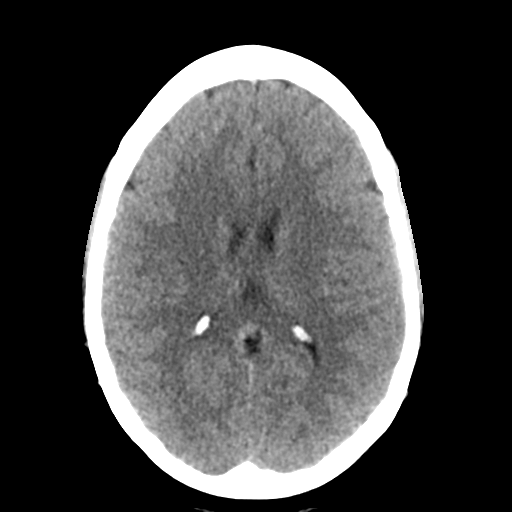
[im 20/32  brain]
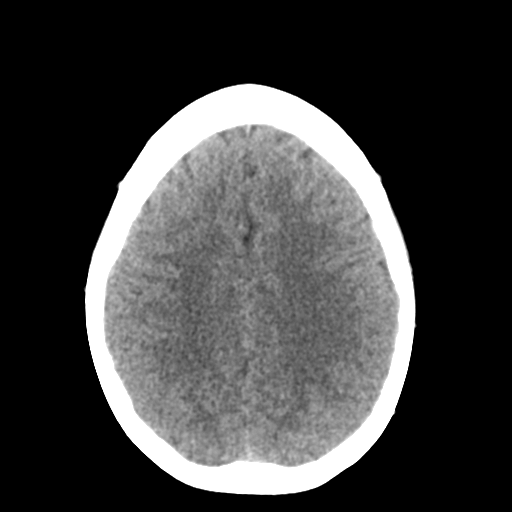
[im 20/32  bone]
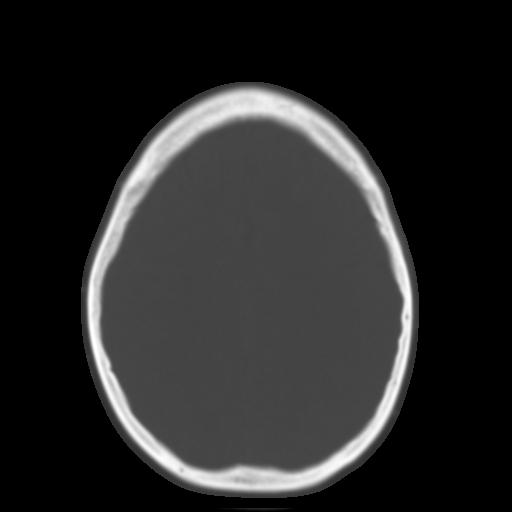
[im 24/32  brain]
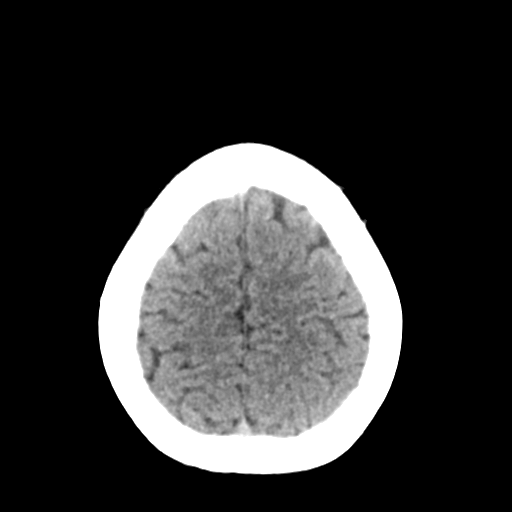
[im 28/32  brain]
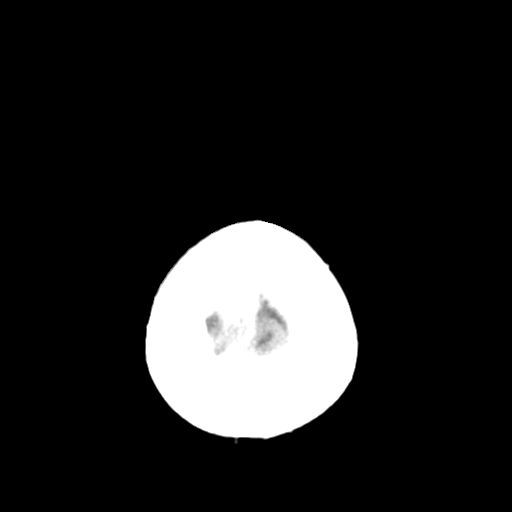

[Series 4: head bone · axial · 0.39mm/px · z∈[-90,-58]mm · 3 of 80 slices shown]
[im 8/80  bone]
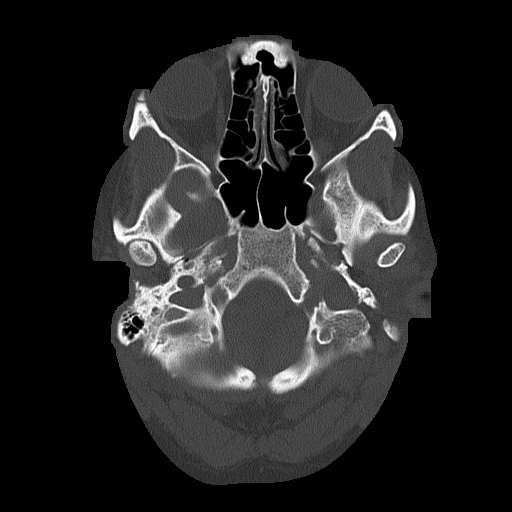
[im 16/80  bone]
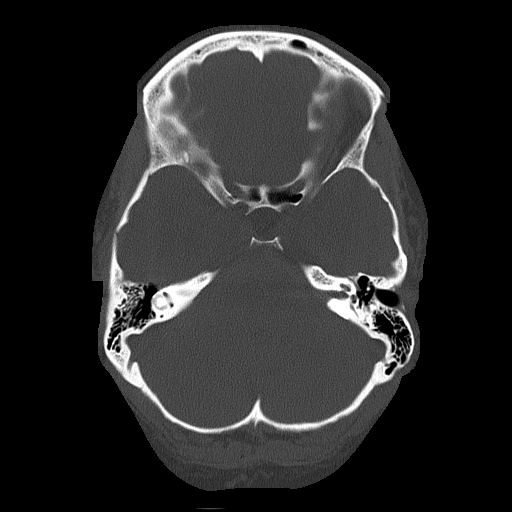
[im 24/80  bone]
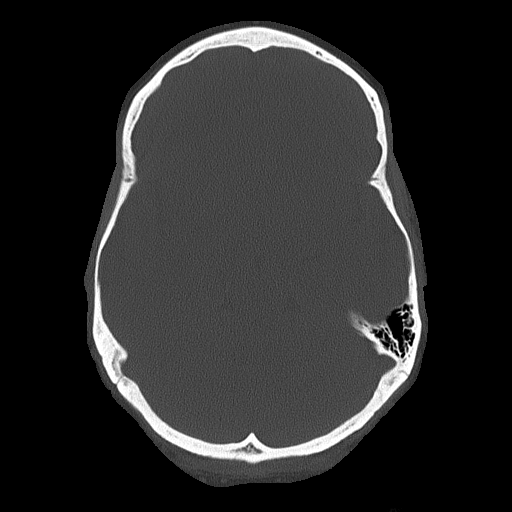

[Series 5: head without cor · coronal · non-contrast · 0.30mm/px · 3 of 65 slices shown]
[im 22/65  brain]
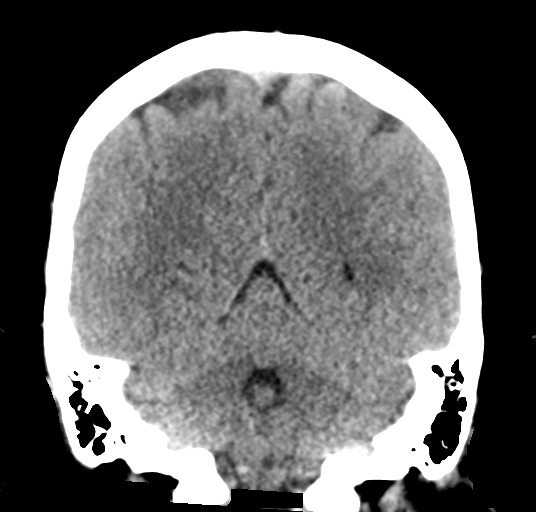
[im 29/65  brain]
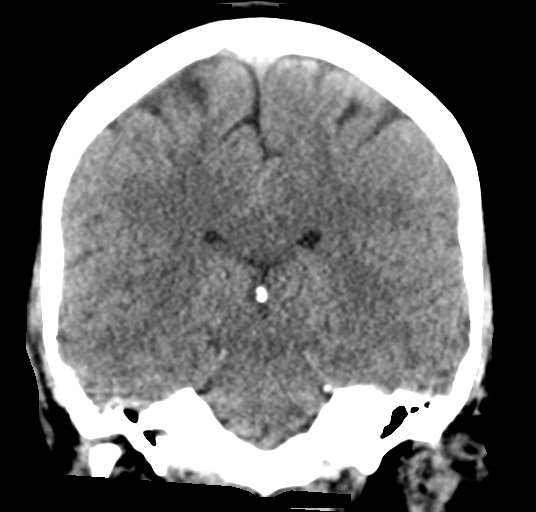
[im 36/65  brain]
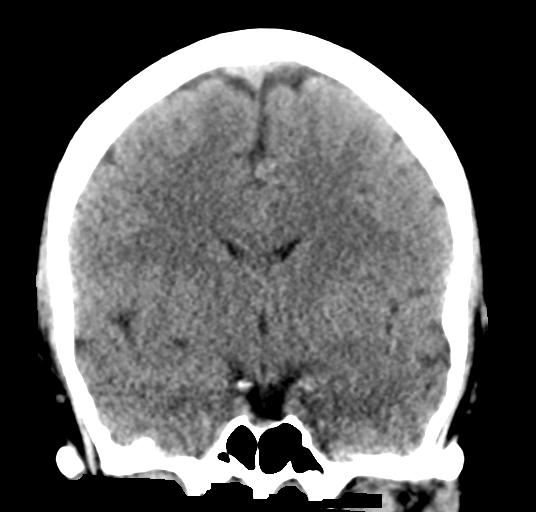

[Series 6: head without sag · sagittal · non-contrast · 0.30mm/px · 3 of 55 slices shown]
[im 19/55  brain]
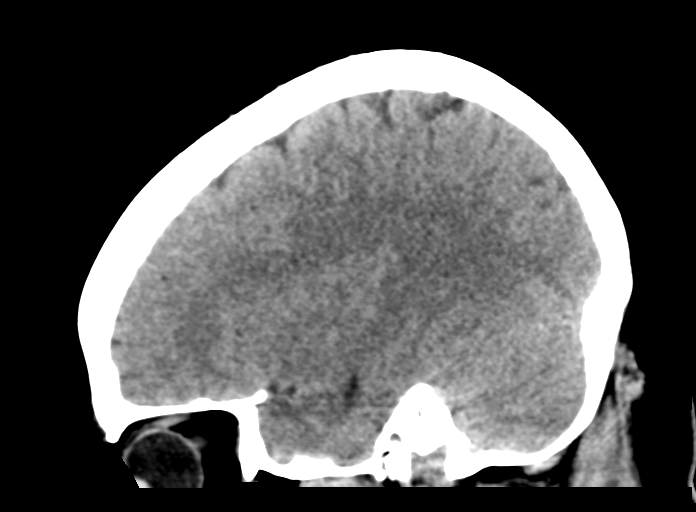
[im 28/55  brain]
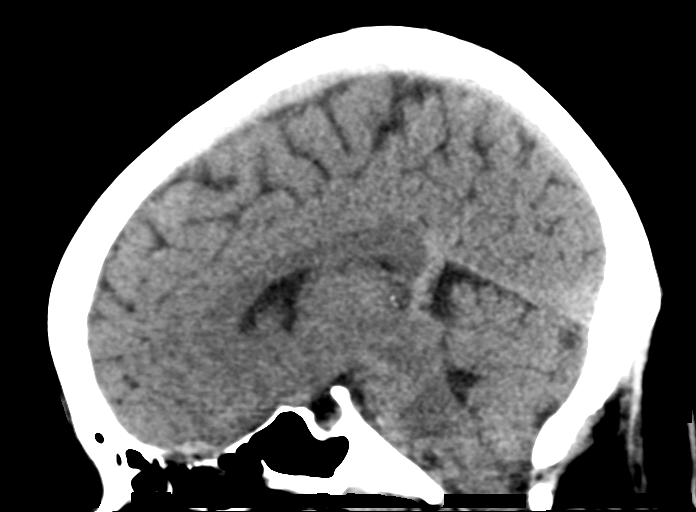
[im 37/55  brain]
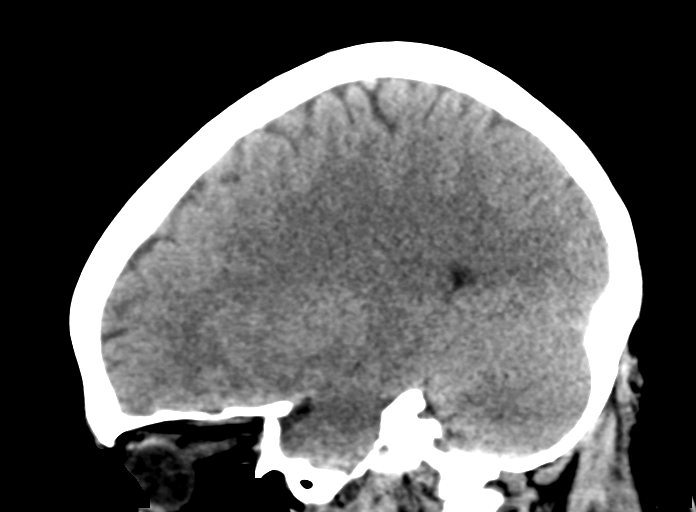

[16 of 47 positions shown; findings below may reference images not displayed]

FINDINGS: Brain: The previously noted stable pineal cyst is again
demonstrated. Otherwise, normal appearing cerebral hemispheres and
posterior fossa structures. Normal size and position of the
ventricles.

Vascular: No hyperdense vessel or unexpected calcification.

Skull: Normal. Negative for fracture or focal lesion.

Sinuses/Orbits: Unremarkable.

Other: None.
IMPRESSION: No acute abnormality. Stable pineal cyst.

## 2019-09-30 ENCOUNTER — Encounter: Payer: Self-pay | Admitting: Emergency Medicine

## 2019-09-30 ENCOUNTER — Emergency Department: Payer: Medicaid Other

## 2019-09-30 ENCOUNTER — Other Ambulatory Visit: Payer: Self-pay

## 2019-09-30 ENCOUNTER — Emergency Department
Admission: EM | Admit: 2019-09-30 | Discharge: 2019-09-30 | Disposition: A | Discharge status: Home / Self Care | Payer: Medicaid Other | Attending: Emergency Medicine | Admitting: Emergency Medicine

## 2019-09-30 DIAGNOSIS — R0789 Other chest pain: Secondary | ICD-10-CM | POA: Insufficient documentation

## 2019-09-30 DIAGNOSIS — R0602 Shortness of breath: Secondary | ICD-10-CM | POA: Diagnosis not present

## 2019-09-30 DIAGNOSIS — Z5321 Procedure and treatment not carried out due to patient leaving prior to being seen by health care provider: Secondary | ICD-10-CM | POA: Diagnosis not present

## 2019-09-30 LAB — BASIC METABOLIC PANEL
Anion gap: 8 (ref 5–15)
BUN: 10 mg/dL (ref 6–20)
CO2: 22 mmol/L (ref 22–32)
Calcium: 8.6 mg/dL — ABNORMAL LOW (ref 8.9–10.3)
Chloride: 109 mmol/L (ref 98–111)
Creatinine, Ser: 0.85 mg/dL (ref 0.44–1.00)
GFR calc Af Amer: >60 mL/min (ref >60)
GFR calc non Af Amer: >60 mL/min (ref >60)
Glucose, Bld: 86 mg/dL (ref 70–99)
Potassium: 3.5 mmol/L (ref 3.5–5.1)
Sodium: 139 mmol/L (ref 135–145)

## 2019-09-30 LAB — CBC
HCT: 36.6 % (ref 36.0–46.0)
Hemoglobin: 13.2 g/dL (ref 12.0–15.0)
MCH: 33.3 pg (ref 26.0–34.0)
MCHC: 36.1 g/dL — ABNORMAL HIGH (ref 30.0–36.0)
MCV: 92.4 fL (ref 80.0–100.0)
Platelets: 235 10*3/uL (ref 150–400)
RBC: 3.96 MIL/uL (ref 3.87–5.11)
RDW: 11.9 % (ref 11.5–15.5)
WBC: 5.2 10*3/uL (ref 4.0–10.5)
nRBC: 0 % (ref 0.0–0.2)

## 2019-09-30 LAB — TROPONIN I (HIGH SENSITIVITY): Troponin I (High Sensitivity): <2 ng/L (ref <18)

## 2019-09-30 NOTE — ED Triage Notes (Signed)
Pt c/o central radiating chest pressure x1 day. Pain radiates into back. Pt denies N/V but reports SOB.

## 2019-09-30 NOTE — ED Notes (Signed)
Pt to STAT desk and states she does not want to wait and will return if her symptoms worsen.

## 2019-10-01 ENCOUNTER — Emergency Department
Admission: EM | Admit: 2019-10-01 | Discharge: 2019-10-01 | Disposition: A | Discharge status: Home / Self Care | Payer: Medicaid Other | Attending: Student in an Organized Health Care Education/Training Program | Admitting: Student in an Organized Health Care Education/Training Program

## 2019-10-01 ENCOUNTER — Telehealth: Payer: Self-pay | Admitting: Emergency Medicine

## 2019-10-01 ENCOUNTER — Emergency Department: Payer: Medicaid Other

## 2019-10-01 ENCOUNTER — Other Ambulatory Visit: Payer: Self-pay

## 2019-10-01 DIAGNOSIS — R2241 Localized swelling, mass and lump, right lower limb: Secondary | ICD-10-CM | POA: Diagnosis not present

## 2019-10-01 DIAGNOSIS — R079 Chest pain, unspecified: Secondary | ICD-10-CM

## 2019-10-01 DIAGNOSIS — R0789 Other chest pain: Secondary | ICD-10-CM | POA: Insufficient documentation

## 2019-10-01 DIAGNOSIS — J45909 Unspecified asthma, uncomplicated: Secondary | ICD-10-CM | POA: Insufficient documentation

## 2019-10-01 DIAGNOSIS — Z87891 Personal history of nicotine dependence: Secondary | ICD-10-CM | POA: Insufficient documentation

## 2019-10-01 LAB — BASIC METABOLIC PANEL
Anion gap: 6 (ref 5–15)
BUN: 10 mg/dL (ref 6–20)
CO2: 24 mmol/L (ref 22–32)
Calcium: 8.7 mg/dL — ABNORMAL LOW (ref 8.9–10.3)
Chloride: 110 mmol/L (ref 98–111)
Creatinine, Ser: 0.77 mg/dL (ref 0.44–1.00)
GFR calc Af Amer: >60 mL/min (ref >60)
GFR calc non Af Amer: >60 mL/min (ref >60)
Glucose, Bld: 89 mg/dL (ref 70–99)
Potassium: 3.7 mmol/L (ref 3.5–5.1)
Sodium: 140 mmol/L (ref 135–145)

## 2019-10-01 LAB — CBC
HCT: 38.7 % (ref 36.0–46.0)
Hemoglobin: 13.8 g/dL (ref 12.0–15.0)
MCH: 32.8 pg (ref 26.0–34.0)
MCHC: 35.7 g/dL (ref 30.0–36.0)
MCV: 91.9 fL (ref 80.0–100.0)
Platelets: 255 10*3/uL (ref 150–400)
RBC: 4.21 MIL/uL (ref 3.87–5.11)
RDW: 11.9 % (ref 11.5–15.5)
WBC: 5 10*3/uL (ref 4.0–10.5)
nRBC: 0 % (ref 0.0–0.2)

## 2019-10-01 LAB — HEPATIC FUNCTION PANEL
ALT: 14 U/L (ref 0–44)
AST: 15 U/L (ref 15–41)
Albumin: 3.8 g/dL (ref 3.5–5.0)
Alkaline Phosphatase: 50 U/L (ref 38–126)
Bilirubin, Direct: <0.1 mg/dL (ref 0.0–0.2)
Total Bilirubin: 0.7 mg/dL (ref 0.3–1.2)
Total Protein: 6.3 g/dL — ABNORMAL LOW (ref 6.5–8.1)

## 2019-10-01 LAB — POCT PREGNANCY, URINE: Preg Test, Ur: NEGATIVE

## 2019-10-01 LAB — LIPASE, BLOOD: Lipase: 27 U/L (ref 11–51)

## 2019-10-01 LAB — FIBRIN DERIVATIVES D-DIMER (ARMC ONLY): Fibrin derivatives D-dimer (ARMC): 379.38 ng/mL (FEU) (ref 0.00–499.00)

## 2019-10-01 LAB — TROPONIN I (HIGH SENSITIVITY)
Troponin I (High Sensitivity): <2 ng/L (ref <18)
Troponin I (High Sensitivity): <2 ng/L (ref <18)

## 2019-10-01 MED ORDER — SODIUM CHLORIDE 0.9% FLUSH: 3.0000 mL | Freq: Once | INTRAVENOUS | Status: DC

## 2019-10-01 MED ORDER — OXYCODONE-ACETAMINOPHEN 5-325 MG PO TABS
1.0000 | ORAL_TABLET | Freq: Once | ORAL | Status: AC
  Administered 2019-10-01: 1 via ORAL
  Filled 2019-10-01: qty 1

## 2019-10-01 NOTE — ED Provider Notes (Signed)
[facility] Emergency Department Provider Note    First MD Initiated Contact with Patient 10/01/19 1919     (approximate)  I have reviewed the triage vital signs and the nursing notes.   HISTORY  Chief Complaint Chest Pain    HPI Aveya A Lerette is a 34 y.o. female [place] past medical history presents to the ER for evaluation of 3 days of midsternal nonradiating chest pain.  Is not complaining of any shortness of breath.  Did recently travel to [place] and has had some achiness in her right leg.  No history of DVTs.  She not on OCP.  Does not smoke.  States that she has been seen for similar symptoms in the past and was told it was anxiety but has taken her home Klonopin without any improvement.    Past Medical History:  Diagnosis Date  . Asthma   . Bilateral ovarian cysts   . Bipolar 1 disorder (HCC)   . Headache    Family History  Problem Relation Age of Onset  . CVA Mother   . COPD Mother   . Emphysema Mother   . Kidney disease Mother   . Migraines Mother   . CVA Father   . Hypertension Father   . Arthritis Father   . Other Brother 29       yellow jacket   . Heart disease Brother        RBBB   Past Surgical History:  Procedure Laterality Date  . ANKLE ARTHROSCOPY Left   . APPENDECTOMY    . TONSILLECTOMY    . TUBAL LIGATION     There are no problems to display for this patient.     Prior to Admission medications   Medication Sig Start Date End Date Taking? Authorizing Provider  acetaminophen (TYLENOL) 325 MG tablet Take 650 mg by mouth every 6 (six) hours as needed for mild pain, moderate pain or headache. For pain    [provider]  benzonatate (TESSALON) 100 MG capsule Take 1 capsule (100 mg total) by mouth every 8 (eight) hours. 02/11/19   Fayrene Helper, PA-C  cyclobenzaprine (FLEXERIL) 5 MG tablet Take 1 tablet (5 mg total) by mouth 3 (three) times daily as needed for muscle spasms. 04/22/18   Menshew, Charlesetta Ivory, PA-C  Multiple Vitamin (MULTIVITAMIN) capsule Take 1 capsule by mouth daily.    [provider]  SUMAtriptan (IMITREX) 100 MG tablet Take 1 tablet at earliest onset of migraine.  May repeat x1 in 2 hours if headache persists or recurs.  Max 2 tablets/24 h 04/27/18   Everlena Cooper, Adam R, DO  topiramate (TOPAMAX) 50 MG tablet Take 1 tablet (50 mg total) by mouth at bedtime. 04/27/18   Drema Dallas, DO    Allergies 2,4-d dimethylamine (amisol); Geodon [ziprasidone hcl]; Norco [hydrocodone-acetaminophen]; Tramadol; Penicillins; Zofran [ondansetron hcl]; and Rocephin [ceftriaxone]    Social History Social History   Tobacco Use  . Smoking status: Former Smoker    Packs/day: 0.50    Types: Cigarettes  . Smokeless tobacco: Never Used  Vaping Use  . Vaping Use: Every day  Substance Use Topics  . Alcohol use: No    Comment: occasional  . Drug use: No    Review of Systems Patient denies headaches, rhinorrhea, blurry vision, numbness, shortness of breath, chest pain, edema, cough, abdominal pain, nausea, vomiting, diarrhea, dysuria, fevers, rashes or hallucinations unless otherwise stated above in HPI. ____________________________________________   PHYSICAL EXAM:  VITAL  SIGNS: Vitals:   10/01/19 2030 10/01/19 2155  BP: 127/84 102/60  Pulse: 61 (!) 56  Resp: 16 16  Temp:  98 F (36.7 C)  SpO2: 97% 98%    Constitutional: Alert and oriented.  Eyes: Conjunctivae are normal.  Head: Atraumatic. Nose: No congestion/rhinnorhea. Mouth/Throat: Mucous membranes are moist.   Neck: No stridor. Painless ROM.  Cardiovascular: Normal rate, regular rhythm. Grossly normal heart sounds.  Good peripheral circulation. Respiratory: Normal respiratory effort.  No retractions. Lungs CTAB. Gastrointestinal: Soft and nontender. No distention. No abdominal bruits. No CVA tenderness. Genitourinary:  Musculoskeletal: No lower extremity tenderness nor edema.  No joint effusions. Neurologic:   Normal speech and language. No gross focal neurologic deficits are appreciated. No facial droop Skin:  Skin is warm, dry and intact. No rash noted. Psychiatric: Mood and affect are normal. Speech and behavior are normal.  ____________________________________________   LABS (all labs ordered are listed, but only abnormal results are displayed)  Results for orders placed or performed during the hospital encounter of 10/01/19 (from the past 24 hour(s))  Basic metabolic panel     Status: Abnormal   Collection Time: 10/01/19  4:21 PM  Result Value Ref Range   Sodium 140 135 - 145 mmol/L   Potassium 3.7 3.5 - 5.1 mmol/L   Chloride 110 98 - 111 mmol/L   CO2 24 22 - 32 mmol/L   Glucose, Bld 89 70 - 99 mg/dL   BUN 10 6 - 20 mg/dL   Creatinine, Ser 3.81 0.44 - 1.00 mg/dL   Calcium 8.7 (L) 8.9 - 10.3 mg/dL   GFR calc non Af Amer >60 >60 mL/min   GFR calc Af Amer >60 >60 mL/min   Anion gap 6 5 - 15  CBC     Status: None   Collection Time: 10/01/19  4:21 PM  Result Value Ref Range   WBC 5.0 4.0 - 10.5 K/uL   RBC 4.21 3.87 - 5.11 MIL/uL   Hemoglobin 13.8 12.0 - 15.0 g/dL   HCT 82.9 36 - 46 %   MCV 91.9 80.0 - 100.0 fL   MCH 32.8 26.0 - 34.0 pg   MCHC 35.7 30.0 - 36.0 g/dL   RDW 93.7 16.9 - 67.8 %   Platelets 255 150 - 400 K/uL   nRBC 0.0 0.0 - 0.2 %  Troponin I (High Sensitivity)     Status: None   Collection Time: 10/01/19  4:21 PM  Result Value Ref Range   Troponin I (High Sensitivity) <2 <18 ng/L  Hepatic function panel     Status: Abnormal   Collection Time: 10/01/19  4:21 PM  Result Value Ref Range   Total Protein 6.3 (L) 6.5 - 8.1 g/dL   Albumin 3.8 3.5 - 5.0 g/dL   AST 15 15 - 41 U/L   ALT 14 0 - 44 U/L   Alkaline Phosphatase 50 38 - 126 U/L   Total Bilirubin 0.7 0.3 - 1.2 mg/dL   Bilirubin, Direct <9.3 0.0 - 0.2 mg/dL   Indirect Bilirubin NOT CALCULATED 0.3 - 0.9 mg/dL  Lipase, blood     Status: None   Collection Time: 10/01/19  4:21 PM  Result Value Ref Range    Lipase 27 11 - 51 U/L  Troponin I (High Sensitivity)     Status: None   Collection Time: 10/01/19  6:52 PM  Result Value Ref Range   Troponin I (High Sensitivity) <2 <18 ng/L  Fibrin derivatives D-Dimer ([facility] only)  Status: None   Collection Time: 10/01/19  7:48 PM  Result Value Ref Range   Fibrin derivatives D-dimer ([facility]) 379.38 0.00 - 499.00 ng/mL (FEU)  Pregnancy, urine POC     Status: None   Collection Time: 10/01/19  7:56 PM  Result Value Ref Range   Preg Test, Ur NEGATIVE NEGATIVE   ____________________________________________  EKG My review and personal interpretation at Time: 16:11   Indication: chest pain  Rate: 70  Rhythm: sinus Axis: normal Other: normal intervals, no stemi ____________________________________________  RADIOLOGY  I personally reviewed all radiographic images ordered to evaluate for the above acute complaints and reviewed radiology reports and findings.  These findings were personally discussed with the patient.  Please see medical record for radiology report.  ____________________________________________   PROCEDURES  Procedure(s) performed:  Procedures    Critical Care performed: no ____________________________________________   INITIAL IMPRESSION / ASSESSMENT AND PLAN / ED COURSE  Pertinent labs & imaging results that were available during my care of the patient were reviewed by me and considered in my medical decision making (see chart for details).   DDX: ACS, pericarditis, esophagitis, boerhaaves, pe, dissection, pna, bronchitis, costochondritis   Cailan A Vignola is a 34 y.o. who presents to the ED with symptoms as described above.  Patient low risk by heart score with negative enzymes nonischemic EKG.  Does not seem consistent with pericarditis.  Her abdominal exam is soft benign.  Given recent travel will add on D-dimer as well as duplex of right lower extremity.  Suspect musculoskeletal strain.  Will give pain medication and  reassess.  Clinical Course as of Oct 02 2  Fri Oct 01, 2019  2137 Patient reassessed.  Pain-free.  Work-up is reassuring.  I think she is appropriate for outpatient follow-up.  Have discussed with the patient and available family all diagnostics and treatments performed thus far and all questions were answered to the best of my ability. The patient demonstrates understanding and agreement with plan.    [PR]    Clinical Course User Index [PR] Willy Eddy, MD    The patient was evaluated in Emergency Department today for the symptoms described in the history of present illness. He/she was evaluated in the context of the global COVID-19 pandemic, which necessitated consideration that the patient might be at risk for infection with the SARS-CoV-2 virus that causes COVID-19. Institutional protocols and algorithms that pertain to the evaluation of patients at risk for COVID-19 are in a state of rapid change based on information released by regulatory bodies including the CDC and federal and state organizations. These policies and algorithms were followed during the patient's care in the ED.  As part of my medical decision making, I reviewed the following data within the electronic MEDICAL RECORD NUMBER Nursing notes reviewed and incorporated, Labs reviewed, notes from prior ED visits and [place] Controlled Substance Database   ____________________________________________   FINAL CLINICAL IMPRESSION(S) / ED DIAGNOSES  Final diagnoses:  Nonspecific chest pain      NEW MEDICATIONS STARTED DURING THIS VISIT:  Discharge Medication List as of 10/01/2019  9:38 PM       Note:  This document was prepared using Dragon voice recognition software and may include unintentional dictation errors.    Willy Eddy, MD 10/02/19 [phone]

## 2019-10-01 NOTE — ED Notes (Signed)
Pt updated in WR, VS reassessed °

## 2019-10-01 NOTE — ED Triage Notes (Signed)
Pt states that she has been having chest pain for the past 2 days, came to be seen yesterday but couldn't stay due to the wait time, pt is c/o sharp pain that radiates through to her back and at her right shoulder blade, not made worse with  movement

## 2019-10-01 NOTE — Telephone Encounter (Signed)
Called patient due to lwot to inquire about condition and follow up plans. Answered but got cut off.  Called back and I left her a message.

## 2019-10-01 NOTE — Telephone Encounter (Signed)
Patient called me back.  Says she feels the same.  She has not gotten in touch with pcp as she could not get through.  I told her she can always return here, but there may be a wait again.  She is going to try to call her pcp back again.  I told her to let them know she has results available in epic.

## 2019-11-23 ENCOUNTER — Other Ambulatory Visit: Payer: Medicaid Other

## 2019-11-23 ENCOUNTER — Other Ambulatory Visit: Payer: Self-pay

## 2019-11-23 DIAGNOSIS — Z20822 Contact with and (suspected) exposure to covid-19: Secondary | ICD-10-CM

## 2019-11-24 LAB — NOVEL CORONAVIRUS, NAA: SARS-CoV-2, NAA: NOT DETECTED

## 2020-06-03 ENCOUNTER — Other Ambulatory Visit: Payer: Self-pay

## 2020-06-03 ENCOUNTER — Emergency Department
Admission: EM | Admit: 2020-06-03 | Discharge: 2020-06-03 | Disposition: A | Discharge status: Home / Self Care | Payer: Medicaid Other | Attending: Emergency Medicine | Admitting: Emergency Medicine

## 2020-06-03 ENCOUNTER — Encounter: Payer: Self-pay | Admitting: Emergency Medicine

## 2020-06-03 ENCOUNTER — Emergency Department: Payer: Medicaid Other

## 2020-06-03 DIAGNOSIS — Z87891 Personal history of nicotine dependence: Secondary | ICD-10-CM | POA: Insufficient documentation

## 2020-06-03 DIAGNOSIS — B349 Viral infection, unspecified: Secondary | ICD-10-CM | POA: Insufficient documentation

## 2020-06-03 DIAGNOSIS — J029 Acute pharyngitis, unspecified: Secondary | ICD-10-CM | POA: Diagnosis present

## 2020-06-03 DIAGNOSIS — J45909 Unspecified asthma, uncomplicated: Secondary | ICD-10-CM | POA: Insufficient documentation

## 2020-06-03 DIAGNOSIS — Z20822 Contact with and (suspected) exposure to covid-19: Secondary | ICD-10-CM | POA: Insufficient documentation

## 2020-06-03 LAB — RESP PANEL BY RT-PCR (FLU A&B, COVID) ARPGX2
Influenza A by PCR: NEGATIVE
Influenza B by PCR: NEGATIVE
SARS Coronavirus 2 by RT PCR: NEGATIVE

## 2020-06-03 LAB — CBC WITH DIFFERENTIAL/PLATELET
Abs Immature Granulocytes: 0.03 10*3/uL (ref 0.00–0.07)
Basophils Absolute: 0 10*3/uL (ref 0.0–0.1)
Basophils Relative: 0 %
Eosinophils Absolute: 0 10*3/uL (ref 0.0–0.5)
Eosinophils Relative: 0 %
HCT: 38.5 % (ref 36.0–46.0)
Hemoglobin: 13.6 g/dL (ref 12.0–15.0)
Immature Granulocytes: 0 %
Lymphocytes Relative: 5 %
Lymphs Abs: 0.5 10*3/uL — ABNORMAL LOW (ref 0.7–4.0)
MCH: 32.5 pg (ref 26.0–34.0)
MCHC: 35.3 g/dL (ref 30.0–36.0)
MCV: 91.9 fL (ref 80.0–100.0)
Monocytes Absolute: 0.9 10*3/uL (ref 0.1–1.0)
Monocytes Relative: 9 %
Neutro Abs: 8.6 10*3/uL — ABNORMAL HIGH (ref 1.7–7.7)
Neutrophils Relative %: 86 %
Platelets: 176 10*3/uL (ref 150–400)
RBC: 4.19 MIL/uL (ref 3.87–5.11)
RDW: 12.1 % (ref 11.5–15.5)
WBC: 10.2 10*3/uL (ref 4.0–10.5)
nRBC: 0 % (ref 0.0–0.2)

## 2020-06-03 LAB — COMPREHENSIVE METABOLIC PANEL
ALT: 14 U/L (ref 0–44)
AST: 17 U/L (ref 15–41)
Albumin: 3.7 g/dL (ref 3.5–5.0)
Alkaline Phosphatase: 59 U/L (ref 38–126)
Anion gap: 8 (ref 5–15)
BUN: 8 mg/dL (ref 6–20)
CO2: 19 mmol/L — ABNORMAL LOW (ref 22–32)
Calcium: 8.9 mg/dL (ref 8.9–10.3)
Chloride: 107 mmol/L (ref 98–111)
Creatinine, Ser: 0.7 mg/dL (ref 0.44–1.00)
GFR, Estimated: >60 mL/min (ref >60)
Glucose, Bld: 120 mg/dL — ABNORMAL HIGH (ref 70–99)
Potassium: 3.6 mmol/L (ref 3.5–5.1)
Sodium: 134 mmol/L — ABNORMAL LOW (ref 135–145)
Total Bilirubin: 0.5 mg/dL (ref 0.3–1.2)
Total Protein: 6.8 g/dL (ref 6.5–8.1)

## 2020-06-03 LAB — URINALYSIS, COMPLETE (UACMP) WITH MICROSCOPIC
Bilirubin Urine: NEGATIVE
Glucose, UA: NEGATIVE mg/dL
Ketones, ur: NEGATIVE mg/dL
Nitrite: NEGATIVE
Protein, ur: NEGATIVE mg/dL
Specific Gravity, Urine: 1.011 (ref 1.005–1.030)
pH: 5 (ref 5.0–8.0)

## 2020-06-03 LAB — PROTIME-INR
INR: 1 (ref 0.8–1.2)
Prothrombin Time: 12.3 seconds (ref 11.4–15.2)

## 2020-06-03 LAB — GROUP A STREP BY PCR: Group A Strep by PCR: NOT DETECTED

## 2020-06-03 LAB — LACTIC ACID, PLASMA: Lactic Acid, Venous: 1.2 mmol/L (ref 0.5–1.9)

## 2020-06-03 NOTE — ED Triage Notes (Signed)
Pt presents to ER from home with headache, sore throat, fever, nausea and diarrhea for 2 days decreased appetite. Pt talks in complete sentences no distress noted

## 2020-06-03 NOTE — ED Notes (Signed)
Pt states she wants to "get my paperwork and leave". Pt encouraged to stay at least for strep results. Pt informed if she has strep she may need antibiotics. Pt verbalizes understanding.

## 2020-06-03 NOTE — ED Notes (Signed)
Per sylvia, pt took tylenol pta.

## 2020-06-03 NOTE — ED Provider Notes (Signed)
[facility] Emergency Department Provider Note  ____________________________________________   Event Date/Time   First MD Initiated Contact with Patient 06/03/20 [phone]     (approximate)  I have reviewed the triage vital signs and the nursing notes.   HISTORY  Chief Complaint Fever    HPI LESSLIE MOSSA is a 35 y.o. female with history of bipolar disorder, asthma, obesity who presents to the emergency department 2 days of fevers, chills, nonproductive cough, sore throat, nausea, diarrhea.  No vomiting.  Has had some headache.  Able to move her neck without difficulty.  She has not been vaccinated for COVID-19 or the flu.  Was seen in urgent care yesterday and had a negative Covid test.        Past Medical History:  Diagnosis Date  . Asthma   . Bilateral ovarian cysts   . Bipolar 1 disorder (HCC)   . Headache     There are no problems to display for this patient.   Past Surgical History:  Procedure Laterality Date  . ANKLE ARTHROSCOPY Left   . APPENDECTOMY    . TONSILLECTOMY    . TUBAL LIGATION      Prior to Admission medications   Medication Sig Start Date End Date Taking? Authorizing Provider  acetaminophen (TYLENOL) 325 MG tablet Take 650 mg by mouth every 6 (six) hours as needed for mild pain, moderate pain or headache. For pain    [provider]  benzonatate (TESSALON) 100 MG capsule Take 1 capsule (100 mg total) by mouth every 8 (eight) hours. 02/11/19   Fayrene Helper, PA-C  cyclobenzaprine (FLEXERIL) 5 MG tablet Take 1 tablet (5 mg total) by mouth 3 (three) times daily as needed for muscle spasms. 04/22/18   Menshew, Charlesetta Ivory, PA-C  Multiple Vitamin (MULTIVITAMIN) capsule Take 1 capsule by mouth daily.    [provider]  SUMAtriptan (IMITREX) 100 MG tablet Take 1 tablet at earliest onset of migraine.  May repeat x1 in 2 hours if headache persists or recurs.  Max 2 tablets/24 h 04/27/18   Everlena Cooper, Adam R, DO   topiramate (TOPAMAX) 50 MG tablet Take 1 tablet (50 mg total) by mouth at bedtime. 04/27/18   Drema Dallas, DO    Allergies 2,4-d dimethylamine (amisol); Geodon [ziprasidone hcl]; Norco [hydrocodone-acetaminophen]; Tramadol; Penicillins; Zofran [ondansetron hcl]; and Rocephin [ceftriaxone]  Family History  Problem Relation Age of Onset  . CVA Mother   . COPD Mother   . Emphysema Mother   . Kidney disease Mother   . Migraines Mother   . CVA Father   . Hypertension Father   . Arthritis Father   . Other Brother 29       yellow jacket   . Heart disease Brother        RBBB    Social History Social History   Tobacco Use  . Smoking status: Former Smoker    Packs/day: 0.50    Types: Cigarettes  . Smokeless tobacco: Never Used  Vaping Use  . Vaping Use: Every day  Substance Use Topics  . Alcohol use: No    Comment: occasional  . Drug use: No    Review of Systems Constitutional: + fever. Eyes: No visual changes. ENT: +sore throat. Cardiovascular: Denies chest pain. Respiratory: Denies shortness of breath. Gastrointestinal: No vomiting.  Patient is having nausea and diarrhea. Genitourinary: Negative for dysuria. Musculoskeletal: Negative for back pain. Skin: Negative for rash. Neurological: Negative for focal weakness or numbness.  ____________________________________________   PHYSICAL EXAM:  VITAL SIGNS: ED Triage Vitals  Enc Vitals Group     BP 06/03/20 0135 128/80     Pulse Rate 06/03/20 0135 87     Resp 06/03/20 0135 (!) 22     Temp 06/03/20 0135 (!) 101.8 F (38.8 C)     Temp Source 06/03/20 0135 Oral     SpO2 06/03/20 0135 95 %     Weight 06/03/20 0140 219 lb (99.3 kg)     Height 06/03/20 0140 5\' 1"  (1.549 m)     Head Circumference --      Peak Flow --      Pain Score 06/03/20 0139 8     Pain Loc --      Pain Edu? --      Excl. in GC? --    CONSTITUTIONAL: Alert and oriented and responds appropriately to questions. Well-appearing;  well-nourished HEAD: Normocephalic EYES: Conjunctivae clear, pupils appear equal, EOM appear intact ENT: normal nose; moist mucous membranes; No pharyngeal erythema or petechiae, no tonsillar hypertrophy or exudate, no uvular deviation, no unilateral swelling, no trismus or drooling, no muffled voice, normal phonation, no stridor, no dental caries present, no drainable dental abscess noted, no Ludwig's angina, tongue sits flat in the bottom of the mouth, no angioedema, no facial erythema or warmth, no facial swelling; no pain with movement of the neck, no cervical LAD. NECK: Supple, normal ROM, no meningismus CARD: RRR; S1 and S2 appreciated; no murmurs, no clicks, no rubs, no gallops RESP: Normal chest excursion without splinting or tachypnea; breath sounds clear and equal bilaterally; no wheezes, no rhonchi, no rales, no hypoxia or respiratory distress, speaking full sentences ABD/GI: Normal bowel sounds; non-distended; soft, non-tender, no rebound, no guarding, no peritoneal signs, no hepatosplenomegaly BACK: The back appears normal EXT: Normal ROM in all joints; no deformity noted, no edema; no cyanosis SKIN: Normal color for age and race; warm; no rash on exposed skin NEURO: Moves all extremities equally PSYCH: The patient's mood and manner are appropriate.  ____________________________________________   LABS (all labs ordered are listed, but only abnormal results are displayed)  Labs Reviewed  COMPREHENSIVE METABOLIC PANEL - Abnormal; Notable for the following components:      Result Value   Sodium 134 (*)    CO2 19 (*)    Glucose, Bld 120 (*)    All other components within normal limits  CBC WITH DIFFERENTIAL/PLATELET - Abnormal; Notable for the following components:   Neutro Abs 8.6 (*)    Lymphs Abs 0.5 (*)    All other components within normal limits  URINALYSIS, COMPLETE (UACMP) WITH MICROSCOPIC - Abnormal; Notable for the following components:   Color, Urine YELLOW (*)     APPearance HAZY (*)    Hgb urine dipstick SMALL (*)    Leukocytes,Ua SMALL (*)    Bacteria, UA RARE (*)    All other components within normal limits  RESP PANEL BY RT-PCR (FLU A&B, COVID) ARPGX2  GROUP A STREP BY PCR  CULTURE, BLOOD (ROUTINE X 2)  CULTURE, BLOOD (ROUTINE X 2)  LACTIC ACID, PLASMA  PROTIME-INR  POC URINE PREG, ED   ____________________________________________  EKG  None ____________________________________________  RADIOLOGY I, Kristen Ward, personally viewed and evaluated these images (plain radiographs) as part of my medical decision making, as well as reviewing the written report by the radiologist.  ED MD interpretation: Chest x-ray clear  Official radiology report(s): DG Chest 2 View  Result Date: 06/03/2020 CLINICAL DATA:  Headache.  Sore throat and fever. EXAM: CHEST - 2 VIEW COMPARISON:  09/30/2019 FINDINGS: The heart size and mediastinal contours are within normal limits. Both lungs are clear. The visualized skeletal structures are unremarkable. IMPRESSION: No active cardiopulmonary disease. Electronically Signed   By: Deatra Robinson M.D.   On: 06/03/2020 02:17    ____________________________________________   PROCEDURES  Procedure(s) performed (including Critical Care):  Procedures   ____________________________________________   INITIAL IMPRESSION / ASSESSMENT AND PLAN / ED COURSE  As part of my medical decision making, I reviewed the following data within the electronic MEDICAL RECORD NUMBER Nursing notes reviewed and incorporated, Labs reviewed , Old chart reviewed, Radiograph reviewed  and Notes from prior ED visits         Patient here with symptoms of viral illness.  She has had significant work-up while in the waiting room including normal labs, clear chest x-ray, normal-appearing urine, normal lactic, negative strep test and negative flu and Covid swabs.  She is extremely well-appearing at this time, hemodynamically stable.  She appears  well-hydrated, nontoxic.  Doubt sepsis, bacteremia.  She has had previous tonsillectomy and has no signs of pharyngitis on exam.  It seems the sore throat and the high fever tonight are her biggest concerns.  I recommended supportive care instructions for home.  She was provided with a work note already from urgent care.  I do not feel she needs to be on antibiotics at this time.  Patient verbalized understanding and is comfortable with this plan.  At this time, I do not feel there is any life-threatening condition present. I have reviewed, interpreted and discussed all results (EKG, imaging, lab, urine as appropriate) and exam findings with patient/family. I have reviewed nursing notes and appropriate previous records.  I feel the patient is safe to be discharged home without further emergent workup and can continue workup as an outpatient as needed. Discussed usual and customary return precautions. Patient/family verbalize understanding and are comfortable with this plan.  Outpatient follow-up has been provided as needed. All questions have been answered.  ____________________________________________   FINAL CLINICAL IMPRESSION(S) / ED DIAGNOSES  Final diagnoses:  Viral illness     ED Discharge Orders    None      *Please note:  Talaya A Struve was evaluated in Emergency Department on 06/03/2020 for the symptoms described in the history of present illness. She was evaluated in the context of the global COVID-19 pandemic, which necessitated consideration that the patient might be at risk for infection with the SARS-CoV-2 virus that causes COVID-19. Institutional protocols and algorithms that pertain to the evaluation of patients at risk for COVID-19 are in a state of rapid change based on information released by regulatory bodies including the CDC and federal and state organizations. These policies and algorithms were followed during the patient's care in the ED.  Some ED evaluations and  interventions may be delayed as a result of limited staffing during and the pandemic.*   Note:  This document was prepared using Dragon voice recognition software and may include unintentional dictation errors.   Ward, Layla Maw, DO 06/03/20 [phone]

## 2020-06-03 NOTE — Discharge Instructions (Signed)
Your labs, chest x-ray, urine, strep test, Covid and flu swabs were normal/negative today.  I suspect you have a viral illness causing your symptoms.  You do not need antibiotics at this time.  You may alternate Tylenol 1000 mg every 6 hours as needed for pain, fever and Ibuprofen 800 mg every 8 hours as needed for pain, fever.  Please take Ibuprofen with food.  Do not take more than 4000 mg of Tylenol (acetaminophen) in a 24 hour period.  You may gargle with warm salt water and use over-the-counter throat lozenges, Chloraseptic spray to help with your sore throat.

## 2020-06-03 NOTE — ED Notes (Signed)
Pt to triage room 2 for MD exam.

## 2020-06-03 NOTE — ED Notes (Signed)
Charge rn dawn notified of septic workup.

## 2020-06-08 LAB — CULTURE, BLOOD (ROUTINE X 2)
Culture: NO GROWTH
Culture: NO GROWTH
Special Requests: ADEQUATE

## 2021-04-26 ENCOUNTER — Other Ambulatory Visit: Payer: Self-pay | Admitting: Physician Assistant

## 2021-04-26 ENCOUNTER — Ambulatory Visit
Admission: RE | Admit: 2021-04-26 | Discharge: 2021-04-26 | Disposition: A | Discharge status: Home / Self Care | Payer: Medicaid Other | Source: Ambulatory Visit | Attending: Physician Assistant | Admitting: Physician Assistant

## 2021-04-26 ENCOUNTER — Other Ambulatory Visit: Payer: Self-pay

## 2021-04-26 DIAGNOSIS — S86112A Strain of other muscle(s) and tendon(s) of posterior muscle group at lower leg level, left leg, initial encounter: Secondary | ICD-10-CM | POA: Diagnosis present

## 2021-10-25 ENCOUNTER — Ambulatory Visit: Payer: Self-pay

## 2021-10-27 ENCOUNTER — Ambulatory Visit: Payer: Self-pay

## 2021-10-27 ENCOUNTER — Ambulatory Visit
Admission: EM | Admit: 2021-10-27 | Discharge: 2021-10-27 | Disposition: A | Discharge status: Home / Self Care | Payer: Medicaid Other | Attending: Urgent Care | Admitting: Urgent Care

## 2021-10-27 ENCOUNTER — Encounter: Payer: Self-pay | Admitting: Emergency Medicine

## 2021-10-27 DIAGNOSIS — G43911 Migraine, unspecified, intractable, with status migrainosus: Secondary | ICD-10-CM | POA: Diagnosis not present

## 2021-10-27 HISTORY — DX: Migraine, unspecified, not intractable, without status migrainosus: G43.909

## 2021-10-27 MED ORDER — DEXAMETHASONE SODIUM PHOSPHATE 10 MG/ML IJ SOLN: 10.0000 mg | Freq: Once | INTRAMUSCULAR | Status: DC

## 2021-10-27 MED ORDER — TOPIRAMATE 50 MG PO TABS: 50.0000 mg | ORAL_TABLET | Freq: Every day | ORAL | 0 refills | Status: AC

## 2021-10-27 MED ORDER — KETOROLAC TROMETHAMINE 30 MG/ML IJ SOLN: 30.0000 mg | Freq: Once | INTRAMUSCULAR | Status: DC

## 2021-10-27 MED ORDER — SUMATRIPTAN SUCCINATE 100 MG PO TABS: ORAL_TABLET | ORAL | 0 refills | Status: AC

## 2021-10-27 NOTE — ED Provider Notes (Signed)
Roderic Palau    CSN: [id number] Arrival date & time: 10/27/21  1448      History   Chief Complaint Chief Complaint  Patient presents with   Headache    HPI ANETA FIEST is a 36 y.o. female.   36yo female with known hx of migraines presents today for evaluation of R sided head pain. IT has been present for one week. She has had a painful "lump" that hurts to touch and sends an "electrical sensation" to her face. She also endorses frontal head pain. Took tylenol and benadryl thinking it was sinuses which she reports helped some. Does report intermittent blurred vision on the L. No amaurosis fugax. No hx of blood clot. Did have a "very deep concussion" in 2011, but imaging was normal. Used to be on monthly migraine injections, daily topamax, and PRN sumatriptan which were effective for her migraines, but ran out 6 months ago. She denies any fever, nuchal rigidity, slurred speech, gait instability, weakness, dizziness, tinnitus. Does feel nauseated when the knot on her head is palpated. No vomiting.     Headache   Past Medical History:  Diagnosis Date   Asthma    Bilateral ovarian cysts    Bipolar 1 disorder ([place])    Headache    Migraines     There are no problems to display for this patient.   Past Surgical History:  Procedure Laterality Date   ANKLE ARTHROSCOPY Left    APPENDECTOMY     TONSILLECTOMY     TUBAL LIGATION      OB History     Gravida  4   Para  4   Term  3   Preterm  1   AB      Living  4      SAB      IAB      Ectopic      Multiple      Live Births  4            Home Medications    Prior to Admission medications   Medication Sig Start Date End Date Taking? Authorizing Provider  acetaminophen (TYLENOL) 325 MG tablet Take 650 mg by mouth every 6 (six) hours as needed for mild pain, moderate pain or headache. For pain    [provider]  benzonatate (TESSALON) 100 MG capsule Take 1 capsule (100 mg total)  by mouth every 8 (eight) hours. 02/11/19   Domenic Moras, PA-C  cyclobenzaprine (FLEXERIL) 5 MG tablet Take 1 tablet (5 mg total) by mouth 3 (three) times daily as needed for muscle spasms. 04/22/18   Menshew, Dannielle Karvonen, PA-C  Multiple Vitamin (MULTIVITAMIN) capsule Take 1 capsule by mouth daily.    [provider]  SUMAtriptan (IMITREX) 100 MG tablet Take 1 tablet at earliest onset of migraine.  May repeat x1 in 2 hours if headache persists or recurs.  Max 2 tablets/24 h 10/27/21   Ayla Dunigan L, PA  topiramate (TOPAMAX) 50 MG tablet Take 1 tablet (50 mg total) by mouth at bedtime. 10/27/21 11/26/21  Chaney Malling, PA    Family History Family History  Problem Relation Age of Onset   CVA Mother    COPD Mother    Emphysema Mother    Kidney disease Mother    Migraines Mother    CVA Father    Hypertension Father    Arthritis Father    Other Brother 13  yellow jacket    Heart disease Brother        RBBB    Social History Social History   Tobacco Use   Smoking status: Former    Packs/day: 0.50    Types: Cigarettes   Smokeless tobacco: Never  Vaping Use   Vaping Use: Every day  Substance Use Topics   Alcohol use: No    Comment: occasional   Drug use: No     Allergies   2,4-d dimethylamine; Geodon [ziprasidone hcl]; Norco [hydrocodone-acetaminophen]; Tramadol; Zofran [ondansetron hcl]; and Rocephin [ceftriaxone]   Review of Systems Review of Systems  Neurological:  Positive for headaches.  As per HPI   Physical Exam Triage Vital Signs ED Triage Vitals  Enc Vitals Group     BP 10/27/21 1523 102/73     Pulse Rate 10/27/21 1523 68     Resp 10/27/21 1523 16     Temp 10/27/21 1523 98.6 F (37 C)     Temp Source 10/27/21 1523 Oral     SpO2 10/27/21 1523 98 %     Weight --      Height --      Head Circumference --      Peak Flow --      Pain Score 10/27/21 1526 5     Pain Loc --      Pain Edu? --      Excl. in GC? --    No data  found.  Updated Vital Signs BP 102/73 (BP Location: Left Arm)   Pulse 68   Temp 98.6 F (37 C) (Oral)   Resp 16   LMP 10/02/2021 (Approximate)   SpO2 98%   Visual Acuity Right Eye Distance:   Left Eye Distance:   Bilateral Distance:    Right Eye Near:   Left Eye Near:    Bilateral Near:     Physical Exam Vitals and nursing note reviewed.  Constitutional:      General: She is not in acute distress.    Appearance: She is well-developed. She is obese. She is not ill-appearing, toxic-appearing or diaphoretic.  HENT:     Head: Normocephalic and atraumatic.      Comments: 3-31mm tender firm bump to R parietal lobe near midline, skin colored, subcutaneous    Right Ear: Tympanic membrane, ear canal and external ear normal.     Left Ear: Tympanic membrane, ear canal and external ear normal.     Nose: Nose normal.     Mouth/Throat:     Mouth: Mucous membranes are moist.     Pharynx: Oropharynx is clear. No oropharyngeal exudate or posterior oropharyngeal erythema.  Eyes:     General: No visual field deficit or scleral icterus.    Extraocular Movements: Extraocular movements intact.     Right eye: Normal extraocular motion and no nystagmus.     Left eye: Normal extraocular motion and no nystagmus.     Conjunctiva/sclera: Conjunctivae normal.     Pupils: Pupils are equal, round, and reactive to light. Pupils are equal.     Right eye: Pupil is round and reactive.     Left eye: Pupil is round and reactive.  Cardiovascular:     Rate and Rhythm: Normal rate and regular rhythm.     Heart sounds: Normal heart sounds. No murmur heard. Pulmonary:     Effort: Pulmonary effort is normal. No respiratory distress.     Breath sounds: Normal breath sounds.  Abdominal:     Palpations: Abdomen  is soft.     Tenderness: There is no abdominal tenderness.  Musculoskeletal:        General: No swelling or tenderness. Normal range of motion.     Cervical back: Normal range of motion and neck  supple. No rigidity or tenderness.  Lymphadenopathy:     Cervical: No cervical adenopathy.  Skin:    General: Skin is warm and dry.     Capillary Refill: Capillary refill takes less than 2 seconds.  Neurological:     General: No focal deficit present.     Mental Status: She is alert and oriented to person, place, and time. Mental status is at baseline.     Cranial Nerves: Cranial nerves 2-12 are intact. No cranial nerve deficit, dysarthria or facial asymmetry.     Sensory: Sensation is intact. No sensory deficit.     Motor: Motor function is intact. No weakness, tremor, atrophy or pronator drift.     Coordination: Coordination is intact. Romberg sign negative. Coordination normal. Finger-Nose-Finger Test normal.     Gait: Gait is intact. Gait normal.     Deep Tendon Reflexes: Reflexes are normal and symmetric. Reflexes normal.  Psychiatric:        Mood and Affect: Mood normal.        Speech: Speech normal.        Behavior: Behavior normal.      UC Treatments / Results  Labs (all labs ordered are listed, but only abnormal results are displayed) Labs Reviewed - No data to display  EKG   Radiology No results found.  Procedures Procedures (including critical care time)  Medications Ordered in UC   Initial Impression / Assessment and Plan / UC Course  I have reviewed the triage vital signs and the nursing notes.  Pertinent labs & imaging results that were available during my care of the patient were reviewed by me and considered in my medical decision making (see chart for details).     Intractable migraine - pt was offered migraine medication in office which initially agreed to, however did not fully understand these were IM injections and thus declined. She would like to restart her prior medications (sumatriptan and topamax) as they were effective previously. I encouraged pt to follow up with PCP/ neurology for further testing to ensure the lesion is not a developing  neurofibroma. Head to ER if any new or worsening symptoms occurs   Final Clinical Impressions(s) / UC Diagnoses   Final diagnoses:  Intractable migraine with status migrainosus, unspecified migraine type     Discharge Instructions      You were offered medications in office to get rid of your headache, which were declined.  Please follow up with your neurologist to determine if the skin bump could be developing into neurofibromatosis or any other similar conditions. I have refilled your migraine medications, take as prescribed. Call your neurologist Monday to schedule a follow up.      ED Prescriptions     Medication Sig Dispense Auth. Provider   topiramate (TOPAMAX) 50 MG tablet Take 1 tablet (50 mg total) by mouth at bedtime. 30 tablet Oddis Westling L, PA   SUMAtriptan (IMITREX) 100 MG tablet Take 1 tablet at earliest onset of migraine.  May repeat x1 in 2 hours if headache persists or recurs.  Max 2 tablets/24 h 10 tablet Jadie Allington L, PA      PDMP not reviewed this encounter.   Maretta Bees, [place] 10/27/21 1643

## 2021-10-27 NOTE — Discharge Instructions (Addendum)
You were offered medications in office to get rid of your headache, which were declined.  Please follow up with your neurologist to determine if the skin bump could be developing into neurofibromatosis or any other similar conditions. I have refilled your migraine medications, take as prescribed. Call your neurologist Monday to schedule a follow up.

## 2021-10-27 NOTE — ED Triage Notes (Signed)
Patient c/o RT sided head pain that started 1 week ago.   Patient endorses blurry vision and nausea at times.   Patient denies trauma to head.   Patient endorses a " knot present on scalp".   History of Migraines.   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

## 2023-04-01 ENCOUNTER — Encounter: Payer: Self-pay | Admitting: Radiology

## 2023-04-01 ENCOUNTER — Emergency Department: Payer: Medicaid Other

## 2023-04-01 ENCOUNTER — Other Ambulatory Visit: Payer: Self-pay

## 2023-04-01 ENCOUNTER — Emergency Department
Admission: EM | Admit: 2023-04-01 | Discharge: 2023-04-01 | Discharge status: Against Medical Advice | Payer: Medicaid Other | Attending: Emergency Medicine | Admitting: Emergency Medicine

## 2023-04-01 DIAGNOSIS — W2209XA Striking against other stationary object, initial encounter: Secondary | ICD-10-CM | POA: Insufficient documentation

## 2023-04-01 DIAGNOSIS — S0990XA Unspecified injury of head, initial encounter: Secondary | ICD-10-CM | POA: Diagnosis present

## 2023-04-01 DIAGNOSIS — Y990 Civilian activity done for income or pay: Secondary | ICD-10-CM | POA: Insufficient documentation

## 2023-04-01 DIAGNOSIS — Z5321 Procedure and treatment not carried out due to patient leaving prior to being seen by health care provider: Secondary | ICD-10-CM | POA: Diagnosis not present

## 2023-04-01 NOTE — ED Triage Notes (Signed)
 This happened yesterday and she has had nausea and dizziness since.

## 2023-04-01 NOTE — ED Triage Notes (Signed)
 Pt states she works at [employer] and was under a truck when a underbelly flap opened and hit her in the head. Pt states she believes she blacked out for a minute. Impact was right side of head.

## 2023-04-01 NOTE — ED Provider Triage Note (Signed)
 Emergency Medicine Provider Triage Evaluation Note  Angel Brock , a 38 y.o. female  was evaluated in triage.  Pt complains of head injury yesterday while at work. Endorse LOC. Flap from trunk bed hit right posterior scalp. Complains of dizziness, increasing headache and nausea.  Review of Systems  Positive:  Negative:   Physical Exam  BP 104/77 (BP Location: Right Arm)   Pulse (!) 55   Temp 97.9 F (36.6 C) (Oral)   Resp 17   Ht 5' 1 (1.549 m)   Wt 81.6 kg   SpO2 99%   BMI 34.01 kg/m  Gen:   Awake, no distress   Resp:  Normal effort  MSK:   Moves extremities without difficulty  Other:  Head is NCAT; tenderness with palpation on right posterior parietal region. No hematoma or ecchymosis noted.   Medical Decision Making  Medically screening exam initiated at 5:35 PM.  Appropriate orders placed.  Jacquelyn A Livingstone was informed that the remainder of the evaluation will be completed by another provider, this initial triage assessment does not replace that evaluation, and the importance of remaining in the ED until their evaluation is complete.    Margrette, Anelise Staron A, PA-C 04/01/23 1738

## 2023-05-17 ENCOUNTER — Ambulatory Visit
Admission: EM | Admit: 2023-05-17 | Discharge: 2023-05-17 | Disposition: A | Discharge status: Home / Self Care | Payer: Medicaid Other | Attending: Emergency Medicine | Admitting: Emergency Medicine

## 2023-05-17 DIAGNOSIS — J101 Influenza due to other identified influenza virus with other respiratory manifestations: Secondary | ICD-10-CM | POA: Diagnosis not present

## 2023-05-17 DIAGNOSIS — Z8709 Personal history of other diseases of the respiratory system: Secondary | ICD-10-CM

## 2023-05-17 DIAGNOSIS — B349 Viral infection, unspecified: Secondary | ICD-10-CM | POA: Diagnosis not present

## 2023-05-17 LAB — POC COVID19/FLU A&B COMBO
Covid Antigen, POC: NEGATIVE
Influenza A Antigen, POC: POSITIVE — AB
Influenza B Antigen, POC: NEGATIVE

## 2023-05-17 MED ORDER — OSELTAMIVIR PHOSPHATE 75 MG PO CAPS: 75.0000 mg | ORAL_CAPSULE | Freq: Two times a day (BID) | ORAL | 0 refills | Status: AC

## 2023-05-17 MED ORDER — ALBUTEROL SULFATE HFA 108 (90 BASE) MCG/ACT IN AERS: 1.0000 | INHALATION_SPRAY | Freq: Four times a day (QID) | RESPIRATORY_TRACT | 0 refills | Status: AC | PRN

## 2023-05-17 NOTE — ED Triage Notes (Signed)
 Pt c/o cough,congestion,bodyaches & fever since this AM. Tmax 100.5 today. Has tried tylenol & IBU last dose around 0800.

## 2023-05-17 NOTE — ED Provider Notes (Addendum)
 [facility]    CSN: [id number] Arrival date & time: 05/17/23  1416      History   Chief Complaint Chief Complaint  Patient presents with   Fever   Cough   Generalized Body Aches   Nasal Congestion    HPI Angel Brock is a 38 y.o. female.  Patient presents with fever, body aches, congestion, cough since this morning.  Tmax 100.5 at home.  Treating symptoms with Tylenol and ibuprofen; last taken at 0800.  No wheezing, shortness of breath, vomiting, diarrhea.  Her medical history includes asthma; she does not currently have an albuterol inhaler.  The history is provided by the patient and medical records.    Past Medical History:  Diagnosis Date   Asthma    Bilateral ovarian cysts    Bipolar 1 disorder ([facility])    Headache    Migraines     There are no active problems to display for this patient.   Past Surgical History:  Procedure Laterality Date   ANKLE ARTHROSCOPY Left    APPENDECTOMY     TONSILLECTOMY     TUBAL LIGATION      OB History     Gravida  4   Para  4   Term  3   Preterm  1   AB      Living  4      SAB      IAB      Ectopic      Multiple      Live Births  4            Home Medications    Prior to Admission medications   Medication Sig Start Date End Date Taking? Authorizing Provider  acetaminophen (TYLENOL) 325 MG tablet Take 650 mg by mouth every 6 (six) hours as needed for mild pain, moderate pain or headache. For pain   Yes [provider]  albuterol (VENTOLIN HFA) 108 (90 Base) MCG/ACT inhaler Inhale 1-2 puffs into the lungs every 6 (six) hours as needed. 05/17/23  Yes Mickie Bail, NP  Multiple Vitamin (MULTIVITAMIN) capsule Take 1 capsule by mouth daily.   Yes [provider]  oseltamivir (TAMIFLU) 75 MG capsule Take 1 capsule (75 mg total) by mouth every 12 (twelve) hours. 05/17/23  Yes Mickie Bail, NP  benzonatate (TESSALON) 100 MG capsule Take 1 capsule (100 mg total) by mouth every 8  (eight) hours. 02/11/19   Fayrene Helper, PA-C  cyclobenzaprine (FLEXERIL) 5 MG tablet Take 1 tablet (5 mg total) by mouth 3 (three) times daily as needed for muscle spasms. 04/22/18   Menshew, Charlesetta Ivory, PA-C  SUMAtriptan (IMITREX) 100 MG tablet Take 1 tablet at earliest onset of migraine.  May repeat x1 in 2 hours if headache persists or recurs.  Max 2 tablets/24 h 10/27/21   Crain, Whitney L, PA  topiramate (TOPAMAX) 50 MG tablet Take 1 tablet (50 mg total) by mouth at bedtime. 10/27/21 11/26/21  Maretta Bees, PA    Family History Family History  Problem Relation Age of Onset   CVA Mother    COPD Mother    Emphysema Mother    Kidney disease Mother    Migraines Mother    CVA Father    Hypertension Father    Arthritis Father    Other Brother 29       yellow jacket    Heart disease Brother        RBBB  Social History Social History   Tobacco Use   Smoking status: Former    Current packs/day: 0.50    Types: Cigarettes   Smokeless tobacco: Never  Vaping Use   Vaping status: Every Day  Substance Use Topics   Alcohol use: No    Comment: occasional   Drug use: Yes    Types: Marijuana     Allergies   2,4-d dimethylamine; Hydrocodone-acetaminophen; Norco [hydrocodone-acetaminophen]; Tramadol; Ziprasidone hcl; Ziprasidone; Zofran [ondansetron hcl]; and Ceftriaxone   Review of Systems Review of Systems  Constitutional:  Positive for fever. Negative for chills.  HENT:  Positive for congestion. Negative for ear pain and sore throat.   Respiratory:  Positive for cough. Negative for shortness of breath.   Gastrointestinal:  Negative for diarrhea and vomiting.     Physical Exam Triage Vital Signs ED Triage Vitals  Encounter Vitals Group     BP      Systolic BP Percentile      Diastolic BP Percentile      Pulse      Resp      Temp      Temp src      SpO2      Weight      Height      Head Circumference      Peak Flow      Pain Score      Pain Loc      Pain  Education      Exclude from Growth Chart    No data found.  Updated Vital Signs BP 136/78 (BP Location: Left Arm)   Pulse 80   Temp (!) 100.9 F (38.3 C) (Oral)   Resp 16   Ht 5\' 7"  (1.702 m)   Wt 177 lb (80.3 kg)   LMP 04/24/2023 (Approximate)   SpO2 95%   BMI 27.72 kg/m   Visual Acuity Right Eye Distance:   Left Eye Distance:   Bilateral Distance:    Right Eye Near:   Left Eye Near:    Bilateral Near:     Physical Exam Constitutional:      General: She is not in acute distress.    Appearance: She is ill-appearing.  HENT:     Right Ear: Tympanic membrane normal.     Left Ear: Tympanic membrane normal.     Nose: Nose normal.     Mouth/Throat:     Mouth: Mucous membranes are moist.     Pharynx: Oropharynx is clear.  Cardiovascular:     Rate and Rhythm: Normal rate and regular rhythm.     Heart sounds: Normal heart sounds.  Pulmonary:     Effort: Pulmonary effort is normal. No respiratory distress.     Breath sounds: Normal breath sounds. No wheezing.  Neurological:     Mental Status: She is alert.      UC Treatments / Results  Labs (all labs ordered are listed, but only abnormal results are displayed) Labs Reviewed  POC COVID19/FLU A&B COMBO - Abnormal; Notable for the following components:      Result Value   Influenza A Antigen, POC Positive (*)    All other components within normal limits    EKG   Radiology No results found.  Procedures Procedures (including critical care time)  Medications Ordered in UC Medications - No data to display  Initial Impression / Assessment and Plan / UC Course  I have reviewed the triage vital signs and the nursing notes.  Pertinent labs &  imaging results that were available during my care of the patient were reviewed by me and considered in my medical decision making (see chart for details).    Influenza A, history of asthma.  Rapid flu test positive for influenza A.  Rapid COVID negative.  Treating with  Tamiflu.  Refill of albuterol inhaler also sent to pharmacy.  Discussed symptomatic treatment including Tylenol or ibuprofen as needed for fever or discomfort, plain Mucinex as needed for congestion, rest, hydration.  Instructed patient to follow-up with PCP if not improving.  ED precautions given.  Patient agrees to plan of care.   Final Clinical Impressions(s) / UC Diagnoses   Final diagnoses:  Influenza A  History of asthma     Discharge Instructions      Take the Tamiflu as directed.   A refill of your albuterol inhaler was also sent to the pharmacy.  Take Tylenol or ibuprofen as needed for fever or discomfort.  Take plain Mucinex as needed for congestion.  Rest and keep yourself hydrated.    Follow-up with your primary care provider if your symptoms are not improving.         ED Prescriptions     Medication Sig Dispense Auth. Provider   albuterol (VENTOLIN HFA) 108 (90 Base) MCG/ACT inhaler Inhale 1-2 puffs into the lungs every 6 (six) hours as needed. 18 g Mickie Bail, NP   oseltamivir (TAMIFLU) 75 MG capsule Take 1 capsule (75 mg total) by mouth every 12 (twelve) hours. 10 capsule Mickie Bail, NP      PDMP not reviewed this encounter.   Mickie Bail, NP 05/17/23 1454    Mickie Bail, NP 05/17/23 [phone]

## 2023-05-17 NOTE — Discharge Instructions (Addendum)
 Take the Tamiflu as directed.   A refill of your albuterol inhaler was also sent to the pharmacy.  Take Tylenol or ibuprofen as needed for fever or discomfort.  Take plain Mucinex as needed for congestion.  Rest and keep yourself hydrated.    Follow-up with your primary care provider if your symptoms are not improving.

## 2023-05-18 ENCOUNTER — Ambulatory Visit: Payer: Self-pay

## 2023-08-28 ENCOUNTER — Ambulatory Visit: Payer: Self-pay
